# Patient Record
Sex: Female | Born: 1975 | Race: Black or African American | Hispanic: No | Marital: Married | Smoking: Never smoker
Health system: Southern US, Community
[De-identification: ages and names within clinical notes are randomized; demographics above are authoritative.]

## PROBLEM LIST (undated history)

## (undated) DIAGNOSIS — IMO0002 Reserved for concepts with insufficient information to code with codable children: Secondary | ICD-10-CM

## (undated) DIAGNOSIS — R87619 Unspecified abnormal cytological findings in specimens from cervix uteri: Secondary | ICD-10-CM

## (undated) DIAGNOSIS — D649 Anemia, unspecified: Secondary | ICD-10-CM

## (undated) HISTORY — PX: WISDOM TOOTH EXTRACTION: SHX21

## (undated) HISTORY — PX: OTHER SURGICAL HISTORY: SHX169

## (undated) HISTORY — DX: Reserved for concepts with insufficient information to code with codable children: IMO0002

---

## 1996-04-26 HISTORY — PX: LEEP: SHX91

## 1999-03-18 ENCOUNTER — Inpatient Hospital Stay (HOSPITAL_COMMUNITY): Admission: AD | Admit: 1999-03-18 | Discharge: 1999-03-18 | Payer: Self-pay | Admitting: *Deleted

## 1999-10-02 ENCOUNTER — Inpatient Hospital Stay (HOSPITAL_COMMUNITY): Admission: AD | Admit: 1999-10-02 | Discharge: 1999-10-02 | Payer: Self-pay | Admitting: *Deleted

## 2000-01-11 ENCOUNTER — Inpatient Hospital Stay (HOSPITAL_COMMUNITY): Admission: AD | Admit: 2000-01-11 | Discharge: 2000-01-11 | Payer: Self-pay | Admitting: Obstetrics & Gynecology

## 2000-07-10 ENCOUNTER — Inpatient Hospital Stay (HOSPITAL_COMMUNITY): Admission: AD | Admit: 2000-07-10 | Discharge: 2000-07-10 | Payer: Self-pay | Admitting: Obstetrics & Gynecology

## 2000-07-14 ENCOUNTER — Inpatient Hospital Stay (HOSPITAL_COMMUNITY): Admission: AD | Admit: 2000-07-14 | Discharge: 2000-07-14 | Payer: Self-pay | Admitting: Obstetrics & Gynecology

## 2000-12-13 ENCOUNTER — Encounter: Admission: RE | Admit: 2000-12-13 | Discharge: 2000-12-13 | Payer: Self-pay | Admitting: Obstetrics & Gynecology

## 2001-01-03 ENCOUNTER — Other Ambulatory Visit: Admission: RE | Admit: 2001-01-03 | Discharge: 2001-01-03 | Payer: Self-pay | Admitting: Obstetrics

## 2001-01-03 ENCOUNTER — Encounter: Admission: RE | Admit: 2001-01-03 | Discharge: 2001-01-03 | Payer: Self-pay | Admitting: Obstetrics & Gynecology

## 2001-08-08 ENCOUNTER — Inpatient Hospital Stay (HOSPITAL_COMMUNITY): Admission: AD | Admit: 2001-08-08 | Discharge: 2001-08-08 | Payer: Self-pay | Admitting: *Deleted

## 2003-05-16 ENCOUNTER — Inpatient Hospital Stay (HOSPITAL_COMMUNITY): Admission: AD | Admit: 2003-05-16 | Discharge: 2003-05-17 | Payer: Self-pay | Admitting: Family Medicine

## 2003-06-20 ENCOUNTER — Ambulatory Visit (HOSPITAL_COMMUNITY): Admission: RE | Admit: 2003-06-20 | Discharge: 2003-06-20 | Payer: Self-pay | Admitting: Obstetrics & Gynecology

## 2003-08-02 ENCOUNTER — Ambulatory Visit (HOSPITAL_COMMUNITY): Admission: RE | Admit: 2003-08-02 | Discharge: 2003-08-02 | Payer: Self-pay | Admitting: Obstetrics & Gynecology

## 2003-08-08 ENCOUNTER — Ambulatory Visit (HOSPITAL_COMMUNITY): Admission: RE | Admit: 2003-08-08 | Discharge: 2003-08-08 | Payer: Self-pay | Admitting: Obstetrics & Gynecology

## 2003-12-12 ENCOUNTER — Inpatient Hospital Stay (HOSPITAL_COMMUNITY): Admission: AD | Admit: 2003-12-12 | Discharge: 2003-12-13 | Payer: Self-pay | Admitting: Obstetrics & Gynecology

## 2003-12-26 ENCOUNTER — Inpatient Hospital Stay (HOSPITAL_COMMUNITY): Admission: AD | Admit: 2003-12-26 | Discharge: 2003-12-29 | Payer: Self-pay | Admitting: Obstetrics & Gynecology

## 2003-12-27 ENCOUNTER — Encounter (INDEPENDENT_AMBULATORY_CARE_PROVIDER_SITE_OTHER): Payer: Self-pay | Admitting: Specialist

## 2006-09-12 ENCOUNTER — Emergency Department (HOSPITAL_COMMUNITY): Admission: EM | Admit: 2006-09-12 | Discharge: 2006-09-12 | Payer: Self-pay | Admitting: Family Medicine

## 2006-10-11 ENCOUNTER — Ambulatory Visit: Payer: Self-pay | Admitting: Cardiovascular Disease

## 2010-04-26 DIAGNOSIS — R87613 High grade squamous intraepithelial lesion on cytologic smear of cervix (HGSIL): Secondary | ICD-10-CM | POA: Insufficient documentation

## 2010-05-16 ENCOUNTER — Encounter: Payer: Self-pay | Admitting: Obstetrics and Gynecology

## 2010-07-17 ENCOUNTER — Ambulatory Visit (HOSPITAL_COMMUNITY): Admission: RE | Admit: 2010-07-17 | Payer: Self-pay | Source: Ambulatory Visit | Admitting: Obstetrics & Gynecology

## 2010-09-08 NOTE — Assessment & Plan Note (Signed)
[facility]                            CARDIOLOGY OFFICE NOTE   NAME:Tammy Mclaughlin                    MRN:          [id number]  DATE:10/11/2006                            DOB:          [birth year]    Tammy Mclaughlin was seen as an outpatient at the [facility]  office on October 11, 2006, with the chief complaint of palpitations.   HISTORY OF PRESENT ILLNESS:  Ms. Tammy Mclaughlin is a 35 year old woman, who was  evaluated at urgent care last month for palpitations.  She has a long-  standing history of occasional palpitations that were exacerbated during  her pregnancies.  She had minimal symptoms over the past few years, but  had a marked increase in her symptoms last month.  This was not related  to increased caffeine intake or other exacerbating factors.  She had no  associated symptoms.  Her symptoms occurred at rest.  She remained  active and was asymptomatic during exercise or physical activity.  She  specifically denied chest pain, light-headedness, syncope, orthopnea,  PND, or shortness of breath.  She has not taken any medications.  She  drinks very rare caffeine, as it has led to panic attacks in the past.   CURRENT MEDICATIONS:  None.   ALLERGIES:  NKDA.   PAST MEDICAL HISTORY:  No chronic medical problems, surgeries or  hospitalizations.   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  She is married with three children, ages 10, 65 and 2.  She does not  smoke cigarettes, drink alcohol or use illicit drugs.  She does not  perform regular exercise.   FAMILY HISTORY:  Her mother is alive and well at age 32.  She has  hypertension.  She does not know her father's history.  She has no  siblings.  There is no history of coronary artery disease in the family.   REVIEW OF SYSTEMS:  A complete 12-point review of systems was performed.  The only pertinent positive to report is anxiety.   PHYSICAL EXAMINATION:  The  patient is alert and oriented, in no acute  distress.  She is a healthy-appearing woman.  Blood pressure is 116/74,  heart rate 71, respiratory rate is 12.  HEENT:  Normal.  NECK:  Normal carotid upstrokes without bruits.  Jugular venous pressure  is normal.  No thyromegaly or thyroid nodules.  LUNGS:  Clear to auscultation bilaterally.  HEART:  The apex is discrete and nondisplaced.  The heart is regular  rate and rhythm without murmurs or gallops.  ABDOMEN:  Soft, nontender, no organomegaly, no abdominal bruits.  EXTREMITIES:  No clubbing, cyanosis or edema.  Peripheral pulses are 2+  and equal throughout.  There are no femoral arterial bruits.  SKIN:  Warm and dry without rash.  NEUROLOGIC:  Cranial nerves II through XII are intact.  Strength is 5/5  and equal in the arms and legs bilaterally.   EKG:  Shows normal sinus rhythm and is within normal limits.   ASSESSMENT:  [name] is a 35 year old woman with palpitations.  Her  symptoms have greatly improved over the last month and she is back to  her baseline with minimal palpitations at present.  As she has a normal  physical examination and normal electrocardiogram, without any  associated symptoms, I suspect her palpitations are benign.  There are  no concerning aspects of her personal or family history.  I reassured  her and advised that, if she has progressive symptoms, we could consider  outpatient monitoring and assessment of her left ventricular function,  although I am almost certain it is normal.  At this point, I did not  recommend any testing or further evaluation.  If she has problems, I  have advised her to call the office and we can arrange followup at any  time.     Veverly Fells. Excell Seltzer, MD  Electronically Signed    MDC/MedQ  DD: 10/12/2006  DT: 10/12/2006  Job #: [id number]

## 2011-05-03 ENCOUNTER — Other Ambulatory Visit: Payer: Self-pay | Admitting: Obstetrics and Gynecology

## 2011-05-17 ENCOUNTER — Encounter (HOSPITAL_COMMUNITY): Payer: Self-pay | Admitting: *Deleted

## 2011-06-07 ENCOUNTER — Encounter (HOSPITAL_COMMUNITY): Payer: Self-pay

## 2011-06-08 ENCOUNTER — Encounter (HOSPITAL_COMMUNITY): Payer: Self-pay | Admitting: Obstetrics and Gynecology

## 2011-06-08 DIAGNOSIS — R87619 Unspecified abnormal cytological findings in specimens from cervix uteri: Secondary | ICD-10-CM | POA: Diagnosis present

## 2011-06-08 DIAGNOSIS — Z9111 Patient's noncompliance with dietary regimen: Secondary | ICD-10-CM

## 2011-06-08 DIAGNOSIS — R87613 High grade squamous intraepithelial lesion on cytologic smear of cervix (HGSIL): Secondary | ICD-10-CM | POA: Diagnosis present

## 2011-06-08 NOTE — H&P (Signed)
Tammy Mclaughlin is an 36 y.o. female. 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 Date: May 2012 OB History: G4, P3-0-1-3   Menstrual History: Menarche age: 50 LMP 05-22-11    Past Medical History  Diagnosis Date  . Abnormal Pap smear   . Anemia     Past Surgical History  Procedure Date  . Wisdom tooth extraction   . Svd     x 3  . Leep 1998    Family History  Problem Relation Age of Onset  . Diabetes Paternal Grandfather   . Diabetes Paternal Grandmother   . Hypertension Maternal Grandmother   . Hypertension Maternal Grandfather   . Cancer Mother     Social History:  reports that she has never smoked. She has never used smokeless tobacco. She reports that she does not drink alcohol or use illicit drugs.  Allergies: No Known Allergies  Prescriptions prior to admission  Medication Sig Dispense Refill  . levonorgestrel-ethinyl estradiol (AVIANE,ALESSE,LESSINA) 0.1-20 MG-MCG tablet Take 1 tablet by mouth daily.        Review of Systems  Constitutional: Negative.   HENT: Negative for congestion.   Eyes: Negative.   Respiratory: Negative for cough, hemoptysis, sputum production, shortness of breath and wheezing.   Cardiovascular: Negative.   Gastrointestinal: Negative.   Genitourinary: Negative.   Musculoskeletal: Negative.   Skin: Negative.   Neurological: Negative.   Endo/Heme/Allergies: Negative.   Psychiatric/Behavioral: Negative.     Blood pressure 117/73, pulse 78, temperature 98.1 F (36.7 C), temperature source Oral, resp. rate 16, height 5\' 7"  (1.702 m), weight 172 lb (78.019 kg), last  menstrual period 03/27/2011, SpO2 100.00%. Physical Exam  Vitals reviewed. Constitutional: She is oriented to person, place, and time. She appears well-developed and well-nourished.  HENT:  Head: Normocephalic and atraumatic.  Eyes: Conjunctivae are normal. Right eye exhibits no discharge. Left eye exhibits no discharge. No scleral icterus.  Neck: Normal range of motion. Neck supple.  Cardiovascular: Normal rate and regular rhythm.   Respiratory: Effort normal and breath sounds normal.  GI: Soft. Bowel sounds are normal. She exhibits no distension. There is no tenderness.  Genitourinary: Uterus normal. No vaginal discharge found.       Right bartholin's gland enlarged to 4 cm, nontender  Musculoskeletal: Normal range of motion.  Neurological: She is alert and oriented to person, place, and time.  Skin: Skin is warm and dry.  Psychiatric: She has a normal mood and affect.    Results for orders placed during the hospital encounter of 06/17/11 (from the past 24 hour(s))  CBC     Status: Abnormal   Collection Time   06/17/11  9:38 AM      Component Value Range   WBC 6.9  4.0 - 10.5 (K/uL)   RBC 4.02  3.87 - 5.11 (MIL/uL)   Hemoglobin 11.4 (*) 12.0 - 15.0 (g/dL)   HCT 16.1 (*) 09.6 - 46.0 (%)   MCV 87.1  78.0 - 100.0 (fL)   MCH 28.4  26.0 - 34.0 (pg)   MCHC 32.6  30.0 - 36.0 (g/dL)   RDW 04.5  40.9 - 81.1 (%)   Platelets 234  150 - 400 (K/uL)  PREGNANCY, URINE  Status: Normal   Collection Time   06/17/11  9:38 AM      Component Value Range   Preg Test, Ur NEGATIVE  NEGATIVE     No results found.  Assessment/Plan: HGSIL                                Prior LEEP Plan CKC.  Alternatives, risks and benefits discussed and patient wishes to proceed.   Ketura Sirek P 06/17/2011, 10:54 AM

## 2011-06-17 ENCOUNTER — Encounter (HOSPITAL_COMMUNITY): Payer: Self-pay | Admitting: Anesthesiology

## 2011-06-17 ENCOUNTER — Encounter (HOSPITAL_COMMUNITY)
Admission: RE | Disposition: A | Discharge status: Home Health | Payer: Self-pay | Source: Ambulatory Visit | Attending: Obstetrics and Gynecology

## 2011-06-17 ENCOUNTER — Encounter (HOSPITAL_COMMUNITY): Payer: Self-pay | Admitting: *Deleted

## 2011-06-17 ENCOUNTER — Other Ambulatory Visit: Payer: Self-pay | Admitting: Obstetrics and Gynecology

## 2011-06-17 ENCOUNTER — Ambulatory Visit (HOSPITAL_COMMUNITY): Payer: BC Managed Care – PPO | Admitting: Anesthesiology

## 2011-06-17 ENCOUNTER — Ambulatory Visit (HOSPITAL_COMMUNITY)
Admission: RE | Admit: 2011-06-17 | Discharge: 2011-06-17 | Disposition: A | Discharge status: Home Health | Payer: BC Managed Care – PPO | Source: Ambulatory Visit | Attending: Obstetrics and Gynecology | Admitting: Obstetrics and Gynecology

## 2011-06-17 DIAGNOSIS — Z9111 Patient's noncompliance with dietary regimen: Secondary | ICD-10-CM

## 2011-06-17 DIAGNOSIS — R87613 High grade squamous intraepithelial lesion on cytologic smear of cervix (HGSIL): Secondary | ICD-10-CM | POA: Diagnosis present

## 2011-06-17 DIAGNOSIS — N750 Cyst of Bartholin's gland: Secondary | ICD-10-CM | POA: Diagnosis present

## 2011-06-17 DIAGNOSIS — D069 Carcinoma in situ of cervix, unspecified: Secondary | ICD-10-CM | POA: Insufficient documentation

## 2011-06-17 DIAGNOSIS — R87619 Unspecified abnormal cytological findings in specimens from cervix uteri: Secondary | ICD-10-CM | POA: Diagnosis present

## 2011-06-17 HISTORY — PX: CERVICAL CONIZATION W/BX: SHX1330

## 2011-06-17 HISTORY — DX: Unspecified abnormal cytological findings in specimens from cervix uteri: R87.619

## 2011-06-17 HISTORY — DX: Anemia, unspecified: D64.9

## 2011-06-17 HISTORY — DX: Reserved for concepts with insufficient information to code with codable children: IMO0002

## 2011-06-17 LAB — CBC
HCT: 35 % — ABNORMAL LOW (ref 36.0–46.0)
MCHC: 32.6 g/dL (ref 30.0–36.0)
Platelets: 234 10*3/uL (ref 150–400)
RDW: 13.6 % (ref 11.5–15.5)
WBC: 6.9 10*3/uL (ref 4.0–10.5)

## 2011-06-17 LAB — PREGNANCY, URINE: Preg Test, Ur: NEGATIVE

## 2011-06-17 SURGERY — CONE BIOPSY, CERVIX
Anesthesia: General | Site: Vagina | Wound class: Clean Contaminated

## 2011-06-17 MED ORDER — PROPOFOL 10 MG/ML IV EMUL
INTRAVENOUS | Status: AC
  Filled 2011-06-17: qty 50

## 2011-06-17 MED ORDER — KETOROLAC TROMETHAMINE 30 MG/ML IJ SOLN
INTRAMUSCULAR | Status: AC
  Filled 2011-06-17: qty 1

## 2011-06-17 MED ORDER — IBUPROFEN 600 MG PO TABS: 600.0000 mg | ORAL_TABLET | Freq: Four times a day (QID) | ORAL | Status: AC

## 2011-06-17 MED ORDER — VASOPRESSIN 20 UNIT/ML IJ SOLN
INTRAMUSCULAR | Status: AC
  Filled 2011-06-17: qty 1

## 2011-06-17 MED ORDER — VASOPRESSIN 20 UNIT/ML IJ SOLN
INTRAVENOUS | Status: DC | PRN
  Administered 2011-06-17: 11:00:00 via INTRAMUSCULAR

## 2011-06-17 MED ORDER — HYDROCODONE-ACETAMINOPHEN 5-500 MG PO TABS: 1.0000 | ORAL_TABLET | Freq: Four times a day (QID) | ORAL | Status: AC | PRN

## 2011-06-17 MED ORDER — FENTANYL CITRATE 0.05 MG/ML IJ SOLN
INTRAMUSCULAR | Status: DC | PRN
  Administered 2011-06-17 (×2): 50 ug via INTRAVENOUS
  Administered 2011-06-17: 100 ug via INTRAVENOUS

## 2011-06-17 MED ORDER — ONDANSETRON 4 MG PO TBDP
ORAL_TABLET | ORAL | Status: AC
  Administered 2011-06-17: 4 mg via ORAL
  Filled 2011-06-17: qty 1

## 2011-06-17 MED ORDER — FENTANYL CITRATE 0.05 MG/ML IJ SOLN
INTRAMUSCULAR | Status: AC
  Filled 2011-06-17: qty 2

## 2011-06-17 MED ORDER — KETOROLAC TROMETHAMINE 30 MG/ML IJ SOLN
INTRAMUSCULAR | Status: DC | PRN
  Administered 2011-06-17: 30 mg via INTRAVENOUS

## 2011-06-17 MED ORDER — LIDOCAINE HCL (CARDIAC) 20 MG/ML IV SOLN
INTRAVENOUS | Status: AC
  Filled 2011-06-17: qty 5

## 2011-06-17 MED ORDER — ONDANSETRON 4 MG PO TBDP
4.0000 mg | ORAL_TABLET | Freq: Once | ORAL | Status: AC
  Administered 2011-06-17: 4 mg via ORAL

## 2011-06-17 MED ORDER — PROPOFOL 10 MG/ML IV EMUL
INTRAVENOUS | Status: AC
  Filled 2011-06-17: qty 20

## 2011-06-17 MED ORDER — LIDOCAINE HCL (CARDIAC) 20 MG/ML IV SOLN
INTRAVENOUS | Status: DC | PRN
  Administered 2011-06-17: 80 mg via INTRAVENOUS

## 2011-06-17 MED ORDER — KETOROLAC TROMETHAMINE 60 MG/2ML IM SOLN
INTRAMUSCULAR | Status: AC
  Filled 2011-06-17: qty 2

## 2011-06-17 MED ORDER — LIDOCAINE HCL 2 % IJ SOLN
INTRAMUSCULAR | Status: AC
  Filled 2011-06-17: qty 1

## 2011-06-17 MED ORDER — MIDAZOLAM HCL 5 MG/5ML IJ SOLN
INTRAMUSCULAR | Status: DC | PRN
  Administered 2011-06-17: 2 mg via INTRAVENOUS

## 2011-06-17 MED ORDER — LIDOCAINE HCL 2 % IJ SOLN
INTRAMUSCULAR | Status: DC | PRN
  Administered 2011-06-17: 10 mL

## 2011-06-17 MED ORDER — KETOROLAC TROMETHAMINE 60 MG/2ML IM SOLN
INTRAMUSCULAR | Status: DC | PRN
  Administered 2011-06-17: 30 mg via INTRAMUSCULAR

## 2011-06-17 MED ORDER — PROPOFOL 10 MG/ML IV EMUL
INTRAVENOUS | Status: DC | PRN
  Administered 2011-06-17: 200 mg via INTRAVENOUS

## 2011-06-17 MED ORDER — MIDAZOLAM HCL 2 MG/2ML IJ SOLN
INTRAMUSCULAR | Status: AC
  Filled 2011-06-17: qty 2

## 2011-06-17 MED ORDER — METOCLOPRAMIDE HCL 5 MG/ML IJ SOLN
INTRAMUSCULAR | Status: DC | PRN
  Administered 2011-06-17: 10 mg via INTRAVENOUS

## 2011-06-17 MED ORDER — METOCLOPRAMIDE HCL 5 MG/ML IJ SOLN
INTRAMUSCULAR | Status: AC
  Filled 2011-06-17: qty 2

## 2011-06-17 MED ORDER — ONDANSETRON HCL 4 MG/2ML IJ SOLN
INTRAMUSCULAR | Status: AC
  Filled 2011-06-17: qty 2

## 2011-06-17 MED ORDER — LACTATED RINGERS IV SOLN
INTRAVENOUS | Status: DC
  Administered 2011-06-17 (×3): via INTRAVENOUS

## 2011-06-17 SURGICAL SUPPLY — 25 items
APPLICATOR COTTON TIP 6IN STRL (MISCELLANEOUS) ×4 IMPLANT
BLADE SURG 11 STRL SS (BLADE) ×2 IMPLANT
CLOTH BEACON ORANGE TIMEOUT ST (SAFETY) ×2 IMPLANT
CONTAINER PREFILL 10% NBF 60ML (FORM) ×2 IMPLANT
COUNTER NEEDLE 1200 MAGNETIC (NEEDLE) ×2 IMPLANT
ELECT LLETZ BALL 5MM DISP (ELECTRODE) ×2 IMPLANT
ELECT REM PT RETURN 9FT ADLT (ELECTROSURGICAL) ×2
ELECTRODE REM PT RTRN 9FT ADLT (ELECTROSURGICAL) IMPLANT
GLOVE SURG SS PI 6.5 STRL IVOR (GLOVE) ×4 IMPLANT
GOWN PREVENTION PLUS LG XLONG (DISPOSABLE) ×4 IMPLANT
NDL SPNL 22GX3.5 QUINCKE BK (NEEDLE) ×2 IMPLANT
NEEDLE SPNL 22GX3.5 QUINCKE BK (NEEDLE) ×4 IMPLANT
PACK VAGINAL MINOR WOMEN LF (CUSTOM PROCEDURE TRAY) ×2 IMPLANT
PENCIL BUTTON HOLSTER BLD 10FT (ELECTRODE) ×1 IMPLANT
SCOPETTES 8  STERILE (MISCELLANEOUS) ×2
SCOPETTES 8 STERILE (MISCELLANEOUS) ×2 IMPLANT
SPONGE SURGIFOAM ABS GEL 12-7 (HEMOSTASIS) ×1 IMPLANT
SUT VIC AB 0 CT1 18XCR BRD8 (SUTURE) ×1 IMPLANT
SUT VIC AB 0 CT1 8-18 (SUTURE) ×2
SYR CONTROL 10ML LL (SYRINGE) ×4 IMPLANT
SYR TB 1ML 27GX1/2 SAFE (SYRINGE) IMPLANT
SYR TB 1ML 27GX1/2 SAFETY (SYRINGE) ×2
TOWEL OR 17X24 6PK STRL BLUE (TOWEL DISPOSABLE) ×4 IMPLANT
TUBING NON-CON 1/4 X 20 CONN (TUBING) ×2 IMPLANT
YANKAUER SUCT BULB TIP NO VENT (SUCTIONS) ×2 IMPLANT

## 2011-06-17 NOTE — Anesthesia Preprocedure Evaluation (Signed)
Anesthesia Evaluation  Patient identified by MRN, date of birth, ID band Patient awake    Reviewed: Allergy & Precautions, H&P , NPO status , Patient's Chart, lab work & pertinent test results  Airway Mallampati: II TM Distance: >3 FB Neck ROM: full    Dental No notable dental hx. (+) Teeth Intact   Pulmonary neg pulmonary ROS,  clear to auscultation  Pulmonary exam normal       Cardiovascular neg cardio ROS regular Normal    Neuro/Psych Negative Neurological ROS  Negative Psych ROS   GI/Hepatic negative GI ROS, Neg liver ROS,   Endo/Other  Negative Endocrine ROS  Renal/GU negative Renal ROS  Genitourinary negative   Musculoskeletal   Abdominal Normal abdominal exam  (+)   Peds  Hematology negative hematology ROS (+)   Anesthesia Other Findings   Reproductive/Obstetrics negative OB ROS                           Anesthesia Physical Anesthesia Plan  ASA: II  Anesthesia Plan: General LMA   Post-op Pain Management:    Induction:   Airway Management Planned:   Additional Equipment:   Intra-op Plan:   Post-operative Plan:   Informed Consent: I have reviewed the patients History and Physical, chart, labs and discussed the procedure including the risks, benefits and alternatives for the proposed anesthesia with the patient or authorized representative who has indicated his/her understanding and acceptance.     Plan Discussed with: Anesthesiologist, CRNA and Surgeon  Anesthesia Plan Comments:         Anesthesia Quick Evaluation

## 2011-06-17 NOTE — Op Note (Signed)
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 The perineum was prepped with multiple layers of Betadine. The vagina was gently prepped with a Betadine soaked swab being careful not to apply significant friction to the cervix. The bladder was emptied with a red Robinson catheter.  A weighted speculum was placed in the posterior vagina. A paracervical block was placed at the 5 and 7:00 positions with 1% lidocaine for a total of 10 cc. The stroma of the cervix was then infiltrated with a dilute solution of Pitressin for a total of 20 cc by mouth a single-tooth tenaculum was placed on the cervix outside the transition zone. Lugol's stain was applied to the cervix.   hemostatic sutures were placed at the 9 and 3:00 positions and tied down.  A cone shaped specimen was then excised including the cervical os and the Lugol's nonstaining area. The specimen was marked at the 12:00 position with a suture. It was removed from the operative field the endocervical canal was then curetted as a separate specimen.  Hemostatic sutures were placed at the 12 and 6:00 positions, after the conization bed had been cauterized for hemostasis.   a running interlocking suture was placed on the cut and the conization area with adequate hemostasis noted. A piece of Gelfoam was placed in the conization bed. Hemostasis was noted to be adequate. All instruments were then removed from the vagina and  the patient was awakened from general anesthesia and taken to the recovery room in satisfactory condition having tolerated the procedure well with sponge and instrument counts correct.   Specimens to pathology: #1 cervical conization. #2 endocervical curettings  Complications none  Estimated blood loss: Less than 25 cc

## 2011-06-17 NOTE — Transfer of Care (Signed)
Immediate Anesthesia Transfer of Care Note  Patient: Tammy Mclaughlin  Procedure(s) Performed: Procedure(s) (LRB): CONIZATION CERVIX WITH BIOPSY (N/A)  Patient Location: PACU  Anesthesia Type: General  Level of Consciousness: sedated  Airway & Oxygen Therapy: Patient Spontanous Breathing and Patient connected to nasal cannula oxygen  Post-op Assessment: Report given to PACU RN  Post vital signs: Reviewed and stable  Complications: No apparent anesthesia complications

## 2011-06-17 NOTE — Discharge Instructions (Signed)
Conization of the Cervix Conization is the cutting (excision) of a cone-shaped portion of the cervix. This procedure is usually done when there is abnormal bleeding from the cervix. It can also be done to evaluate an abnormal Pap smear or if an abnormality is seen on the cervix during an exam. Conization of the cervix is not done during a menstrual period or pregnancy. BEFORE THE PROCEDURE  Do not eat or drink anything for 6 to 8 hours before the procedure, especially if you are going to be given a drug to make you sleep (general anesthetic).   Do not take aspirin or blood thinners for at least a week before the procedure, or as directed.   Arrive at least an hour before the procedure to read and sign the necessary forms.   Arrange for someone to take you home after the procedure.   If you smoke, do not smoke for 2 weeks before the procedure.  LET YOUR CAREGIVER KNOW THE FOLLOWING:  Allergies to food or medications.   All the medications you are taking including over-the-counter and prescription medications, herbs, eyedrops, and creams.   You develop a cold or an infection.   If you are using illegal drugs or drinking too much alcohol.   Your smoking habits.   Previous problems with anesthetics including novocaine.   The possibility of being pregnant.   History of blood clots or other bleeding problems.   Other medical problems.  RISKS AND COMPLICATIONS   Bleeding.   Infection.   Damage to the cervix.   Injury to surrounding organs.   Problems with the anesthesia.  PROCEDURE Conization of the cervix can be done by:  Cold knife. This type cuts out the cervical canal and the transformation zone (where the normal cells end and the abnormal cells begin) with a scalpel.   LEEP (electrocautery). This type is done with a thin wire that can cut and burn (cauterize) the cervical tissue with an electrical current.   Laser treatment. This type cuts and burns (cauterizes) the  tissue of the cervix to prevent bleeding with a laser beam.  You will be given a drug to make you sleep (general anesthetic) for cold knife and laser treatments, and you will be given a numbing medication (local anesthetic) for the LEEP procedure. Conization is usually done using colposcopy. Colposcopy magnifies the cervix to see it more clearly. The tissue removed is examined under a microscope by a doctor (pathologist). The pathologist will provide a report to your caregiver. This will help your caregiver decide if further treatment is necessary. This report will also help your caregiver decide on the best treatment if your results are abnormal.  AFTER THE PROCEDURE  If you had a general anesthetic, you may be groggy for a couple of hours after the procedure.   If you had a local anesthetic, you will be advised to rest at the surgical center or caregiver's office until you are stable and feel ready to go home.   Have someone take you home.   You may have some cramping for a couple of days.   You may have a bloody discharge or light bleeding for 2 to 4 weeks.   You may have a black discharge coming from the vagina. This is from the paste used on the cervix to prevent bleeding. This is normal discharge.  HOME CARE INSTRUCTIONS   Do not drive for 24 hours.   Avoid strenuous activities and exercises for at least 7 -   10 days.   Only take over-the-counter or prescription medicines for pain, discomfort, or fever as directed by your caregiver.   Do not take aspirin. It can cause or aggravate bleeding.   You may resume your usual diet.   Drink 6 to 8 glasses of fluid a day.   Rest and sleep the first 24 to 48 hours.   Take showers instead of baths until your caregiver gives you the okay.   Do not use tampons, douche or have intercourse for 4 weeks, or as advised by your caregiver.   Do not lift anything over 10 pounds (4.5 kg) for at least 7 to 10 days, or as advised by your caregiver.     Take your temperature twice a day, for 4 to 5 days. Write them down.   Do not drink or drive when taking pain medication.   If you develop constipation:   Take a mild laxative with the advice of your caregiver.   Eat bran foods.   Drink a lot of fluids.   Try to have someone with you or available for you the first 24 to 48 hours, especially if you had a general anesthetic.   Make sure you and your family understand everything about your surgery and recovery.   Follow your caregiver's advice regarding follow-up appointments and Pap smears.  SEEK MEDICAL CARE IF:   You develop a rash.   You are dizzy or light-headed.   You feel sick to your stomach (nauseous).   You develop a bad smelling vaginal discharge.   You have an abnormal reaction or allergy to your medication.   You need stronger pain medication.  SEEK IMMEDIATE MEDICAL CARE IF:   You have blood clots or bleeding that is heavier than a normal menstrual period, or you develop bright red bleeding.   An oral temperature above 102 F (38.9 C) develops and persists for several hours.   You have increasing cramps.   You pass out.   You have painful or bloody urine.   You start throwing up (vomiting).   The pain is not relieved with your pain medication.  Not all test results are available during your visit. If your test results are not back during your the visit, make an appointment with your caregiver to find out the results. Do not assume everything is normal if you have not heard from your caregiver or the medical facility. It is important for you to follow up on all of your test results. Document Released: 01/20/2005 Document Revised: 12/23/2010 Document Reviewed: 11/11/2008 Exit[employer] Patient Information 2012 [employer]. 

## 2011-06-17 NOTE — Anesthesia Postprocedure Evaluation (Signed)
  Anesthesia Post-op Note  Patient: Tammy Mclaughlin  Procedure(s) Performed: Procedure(s) (LRB): CONIZATION CERVIX WITH BIOPSY (N/A)  Patient Location: PACU  Anesthesia Type: General  Level of Consciousness: awake, alert  and oriented  Airway and Oxygen Therapy: Patient Spontanous Breathing  Post-op Pain: none  Post-op Assessment: Post-op Vital signs reviewed, Patient's Cardiovascular Status Stable, Respiratory Function Stable, Patent Airway, No signs of Nausea or vomiting and Pain level controlled  Post-op Vital Signs: Reviewed and stable  Complications: No apparent anesthesia complications

## 2011-06-18 ENCOUNTER — Encounter (HOSPITAL_COMMUNITY): Payer: Self-pay | Admitting: Obstetrics and Gynecology

## 2011-07-12 ENCOUNTER — Encounter (INDEPENDENT_AMBULATORY_CARE_PROVIDER_SITE_OTHER): Payer: BC Managed Care – PPO | Admitting: Obstetrics and Gynecology

## 2011-07-12 DIAGNOSIS — R87613 High grade squamous intraepithelial lesion on cytologic smear of cervix (HGSIL): Secondary | ICD-10-CM

## 2011-10-18 ENCOUNTER — Ambulatory Visit: Payer: BC Managed Care – PPO | Admitting: Obstetrics and Gynecology

## 2012-01-12 ENCOUNTER — Ambulatory Visit (INDEPENDENT_AMBULATORY_CARE_PROVIDER_SITE_OTHER): Payer: BC Managed Care – PPO | Admitting: Obstetrics and Gynecology

## 2012-01-12 ENCOUNTER — Encounter: Payer: Self-pay | Admitting: Obstetrics and Gynecology

## 2012-01-12 VITALS — BP 120/64 | Ht 67.0 in | Wt 178.0 lb

## 2012-01-12 DIAGNOSIS — R350 Frequency of micturition: Secondary | ICD-10-CM

## 2012-01-12 DIAGNOSIS — R87613 High grade squamous intraepithelial lesion on cytologic smear of cervix (HGSIL): Secondary | ICD-10-CM

## 2012-01-12 DIAGNOSIS — Z124 Encounter for screening for malignant neoplasm of cervix: Secondary | ICD-10-CM

## 2012-01-12 MED ORDER — LEVONORGESTREL-ETHINYL ESTRAD 0.1-20 MG-MCG PO TABS: 1.0000 | ORAL_TABLET | Freq: Every day | ORAL | Status: DC

## 2012-01-12 MED ORDER — SULFAMETHOXAZOLE-TMP DS 800-160 MG PO TABS: 1.0000 | ORAL_TABLET | Freq: Two times a day (BID) | ORAL | Status: DC

## 2012-01-12 MED ORDER — MISOPROSTOL 200 MCG PO TABS: ORAL_TABLET | ORAL | Status: DC

## 2012-01-12 NOTE — Progress Notes (Signed)
AEX:  Last Pap: 09/17/2010 WNL: No HSIL Regular Periods:yes Contraception: BC pill  Monthly Breast exam:no Tetanus<58yrs: yes Nl.Bladder Function:no pt c/o uti sx Daily BMs:yes Healthy Diet:no Calcium:no Mammogram:no Date of Mammogram: n/a Exercise:yes Have often Exercise: occasional  Seatbelt: yes Abuse at home: no Stressful work:yes Sigmoid-colonoscopy: n/a Bone Density: No PCP: [facility] Change in PMH: none Change in [facility]: none  Pt c/o urinary pressure and frequency. Believes she may have UTI. Chem 9 not performed due to pt taking AZO. Also wanting to discuss possibly getting Mirena. Subjective:    Tammy Mclaughlin is a 36 y.o. female, [phone], who presents for an annual exam.     History   Social History  . Marital Status: Married    Spouse Name: N/A    Number of Children: N/A  . Years of Education: N/A   Social History Main Topics  . Smoking status: Never Smoker   . Smokeless tobacco: Never Used  . Alcohol Use: No  . Drug Use: No  . Sexually Active: Yes    Birth Control/ Protection: Pill   Other Topics Concern  . None   Social History Narrative  . None    Menstrual cycle:   LMP: Patient's last menstrual period was 12/13/2011.           Cycle:monthly with drawal from BCPs.  No IM bleeding.  When not on BCPs, has q 3 month menses  The following portions of the patient's history were reviewed and updated as appropriate: allergies, current medications, past family history, past medical history, past social history, past surgical history and problem list.  Review of Systems Pertinent items are noted in HPI. Breast:Negative for breast lump,nipple discharge or nipple retraction Gastrointestinal: Negative for abdominal pain, change in bowel habits or rectal bleeding Urinary:  Has freqency, urgency and pelvic pain-dysuria for several days.  Taking OTC meds for now   Objective:    BP 120/64  Ht 5\' 7"  (1.702 m)  Wt 178 lb (80.74 kg)  BMI 27.88 kg/m2  LMP  12/13/2011    Weight:  Wt Readings from Last 1 Encounters:  01/12/12 178 lb (80.74 kg)          BMI: Body mass index is 27.88 kg/(m^2).  General Appearance: Alert, appropriate appearance for age. No acute distress HEENT: Grossly normal Neck / Thyroid: Supple, no masses, nodes or enlargement Lungs: clear to auscultation bilaterally Back: No CVA tenderness Breast Exam: No masses or nodes.No dimpling, nipple retraction or discharge. Cardiovascular: Regular rate and rhythm. S1, S2, no murmur Gastrointestinal: Soft, non-tender, no masses or organomegaly Pelvic Exam: Vulva and vagina appear normal. Bimanual exam reveals normal uterus and adnexa. COMPRESSION OF BLADDER IS PAINFUL Rectovaginal: normal rectal, no masses Lymphatic Exam: Non-palpable nodes in neck, clavicular, axillary, or inguinal regions Skin: no rash or abnormalities Neurologic: Normal gait and speech, no tremor  Psychiatric: Alert and oriented, appropriate affect.   Wet Prep:not applicable Urinalysis:not applicable because of current use of azo capsules UPT: Not done   Assessment:    Probable UTI  Hx abnl pap s/p CKC   Plan:   Septra DS Urine C&S pap smear return annually or prn Wants to have Mirena if covered by insurance STD screening: done Contraception:oral contraceptives (estrogen/progesterone) until Mirena      Dierdre Forth, MD

## 2012-01-12 NOTE — Patient Instructions (Addendum)
Urinary Tract Infection Infections of the urinary tract can start in several places. A bladder infection (cystitis), a kidney infection (pyelonephritis), and a prostate infection (prostatitis) are different types of urinary tract infections (UTIs). They usually get better if treated with medicines (antibiotics) that kill germs. Take all the medicine until it is gone. You or your child may feel better in a few days, but TAKE ALL MEDICINE or the infection may not respond and may become more difficult to treat. HOME CARE INSTRUCTIONS   Drink enough water and fluids to keep the urine clear or pale yellow. Cranberry juice is especially recommended, in addition to large amounts of water.   Avoid caffeine, tea, and carbonated beverages. They tend to irritate the bladder.   Alcohol may irritate the prostate.   Only take over-the-counter or prescription medicines for pain, discomfort, or fever as directed by your caregiver.  To prevent further infections:  Empty the bladder often. Avoid holding urine for long periods of time.   After a bowel movement, women should cleanse from front to back. Use each tissue only once.   Empty the bladder before and after sexual intercourse.  FINDING OUT THE RESULTS OF YOUR TEST Not all test results are available during your visit. If your or your child's test results are not back during the visit, make an appointment with your caregiver to find out the results. Do not assume everything is normal if you have not heard from your caregiver or the medical facility. It is important for you to follow up on all test results. SEEK MEDICAL CARE IF:   There is back pain.   Your baby is older than 3 months with a rectal temperature of 100.5 F (38.1 C) or higher for more than 1 day.   Your or your child's problems (symptoms) are no better in 3 days. Return sooner if you or your child is getting worse.  SEEK IMMEDIATE MEDICAL CARE IF:   There is severe back pain or lower  abdominal pain.   You or your child develops chills.   You have a fever.   Your baby is older than 3 months with a rectal temperature of 102 F (38.9 C) or higher.   Your baby is 30 months old or younger with a rectal temperature of 100.4 F (38 C) or higher.   There is nausea or vomiting.   There is continued burning or discomfort with urination.  MAKE SURE YOU:   Understand these instructions.   Will watch your condition.   Will get help right away if you are not doing well or get worse.  Document Released: 01/20/2005 Document Revised: 04/01/2011 Document Reviewed: 08/25/2006 [facility] Patient Information 2012 [place], [place].  Levonorgestrel intrauterine device (IUD) What is this medicine? LEVONORGESTREL IUD (LEE voe nor jes trel) is a contraceptive (birth control) device. It is used to prevent pregnancy and to treat heavy bleeding that occurs during your period. It can be used for up to 5 years. This medicine may be used for other purposes; ask your health care provider or pharmacist if you have questions. What should I tell my health care provider before I take this medicine? They need to know if you have any of these conditions: -abnormal Pap smear -cancer of the breast, uterus, or cervix -diabetes -endometritis -genital or pelvic infection now or in the past -have more than one sexual partner or your partner has more than one partner -heart disease -history of an ectopic or tubal pregnancy -immune system  problems -IUD in place -liver disease or tumor -problems with blood clots or take blood-thinners -use intravenous drugs -uterus of unusual shape -vaginal bleeding that has not been explained -an unusual or allergic reaction to levonorgestrel, other hormones, silicone, or polyethylene, medicines, foods, dyes, or preservatives -pregnant or trying to get pregnant -breast-feeding How should I use this medicine? This device is placed inside the uterus by a health  care professional. Talk to your pediatrician regarding the use of this medicine in children. Special care may be needed. Overdosage: If you think you have taken too much of this medicine contact a poison control center or emergency room at once. NOTE: This medicine is only for you. Do not share this medicine with others. What if I miss a dose? This does not apply. What may interact with this medicine? Do not take this medicine with any of the following medications: -amprenavir -bosentan -fosamprenavir This medicine may also interact with the following medications: -aprepitant -barbiturate medicines for inducing sleep or treating seizures -bexarotene -griseofulvin -medicines to treat seizures like carbamazepine, ethotoin, felbamate, oxcarbazepine, phenytoin, topiramate -modafinil -pioglitazone -rifabutin -rifampin -rifapentine -some medicines to treat HIV infection like atazanavir, indinavir, lopinavir, nelfinavir, tipranavir, ritonavir -St. John's wort -warfarin This list may not describe all possible interactions. Give your health care provider a list of all the medicines, herbs, non-prescription drugs, or dietary supplements you use. Also tell them if you smoke, drink alcohol, or use illegal drugs. Some items may interact with your medicine. What should I watch for while using this medicine? Visit your doctor or health care professional for regular check ups. See your doctor if you or your partner has sexual contact with others, becomes HIV positive, or gets a sexual transmitted disease. This product does not protect you against HIV infection (AIDS) or other sexually transmitted diseases. You can check the placement of the IUD yourself by reaching up to the top of your vagina with clean fingers to feel the threads. Do not pull on the threads. It is a good habit to check placement after each menstrual period. Call your doctor right away if you feel more of the IUD than just the threads  or if you cannot feel the threads at all. The IUD may come out by itself. You may become pregnant if the device comes out. If you notice that the IUD has come out use a backup birth control method like condoms and call your health care provider. Using tampons will not change the position of the IUD and are okay to use during your period. What side effects may I notice from receiving this medicine? Side effects that you should report to your doctor or health care professional as soon as possible: -allergic reactions like skin rash, itching or hives, swelling of the face, lips, or tongue -fever, flu-like symptoms -genital sores -high blood pressure -no menstrual period for 6 weeks during use -pain, swelling, warmth in the leg -pelvic pain or tenderness -severe or sudden headache -signs of pregnancy -stomach cramping -sudden shortness of breath -trouble with balance, talking, or walking -unusual vaginal bleeding, discharge -yellowing of the eyes or skin Side effects that usually do not require medical attention (report to your doctor or health care professional if they continue or are bothersome): -acne -breast pain -change in sex drive or performance -changes in weight -cramping, dizziness, or faintness while the device is being inserted -headache -irregular menstrual bleeding within first 3 to 6 months of use -nausea This list may not describe all possible side  effects. Call your doctor for medical advice about side effects. You may report side effects to FDA at 1-800-FDA-1088. Where should I keep my medicine? This does not apply. NOTE: This sheet is a summary. It may not cover all possible information. If you have questions about this medicine, talk to your doctor, pharmacist, or health care provider.  2012, [employer]. (05/03/2008 6:39:08 PM)

## 2012-01-14 LAB — PAP IG, CT-NG, RFX HPV ASCU: Chlamydia Probe Amp: NEGATIVE

## 2012-01-25 ENCOUNTER — Encounter: Payer: BC Managed Care – PPO | Admitting: Obstetrics and Gynecology

## 2012-02-08 ENCOUNTER — Telehealth: Payer: Self-pay | Admitting: Obstetrics and Gynecology

## 2012-02-08 NOTE — Telephone Encounter (Signed)
Tc to pt per telephone call. Pt due to start cycle tomorrow. Appt for IUD insertion sched 02/09/12@4 :15 with vph. Pt normally has only spotting on 1st day of menses. Advised pt to still use Cytotec tabs as directed. Pt to also take Tylenol or Ibuprofen prior to appt. Pt voices understanding.

## 2012-02-09 ENCOUNTER — Encounter: Payer: Self-pay | Admitting: Obstetrics and Gynecology

## 2012-02-09 ENCOUNTER — Ambulatory Visit (INDEPENDENT_AMBULATORY_CARE_PROVIDER_SITE_OTHER): Payer: BC Managed Care – PPO | Admitting: Obstetrics and Gynecology

## 2012-02-09 VITALS — BP 112/60 | Ht 66.0 in | Wt 176.0 lb

## 2012-02-09 DIAGNOSIS — Z30430 Encounter for insertion of intrauterine contraceptive device: Secondary | ICD-10-CM

## 2012-02-09 DIAGNOSIS — Z309 Encounter for contraceptive management, unspecified: Secondary | ICD-10-CM

## 2012-02-09 MED ORDER — LEVONORGESTREL 20 MCG/24HR IU IUD
INTRAUTERINE_SYSTEM | Freq: Once | INTRAUTERINE | Status: AC
  Administered 2012-02-09: 1 via INTRAUTERINE

## 2012-02-09 NOTE — Progress Notes (Signed)
IUD INSERTION  IUD: Mirena LOT#: [id number] Exp: 01/16 UPT: negative GC/CHLAMYDIA: negative 01/12/2012 CONSENT SIGNED: yes IBUPROFEN GIVEN: Yes 800 mg given DISINFECTION WITH  bETADINE X3 UTERUS SOUNDED AT 7 CM IUD INSERTED PER PROTOCOL: YES COMPLICATION: NONE PATIENT INSTRUCTED TO CALL IS FEVER OR ABNORMAL PAIN:yes PATIENT INSTRUCTED ON HOW TO CHECK IUD STRINGS: yes FOLLOW UP APPT: 6WKS

## 2012-03-27 ENCOUNTER — Encounter: Payer: BC Managed Care – PPO | Admitting: Obstetrics and Gynecology

## 2013-05-02 DIAGNOSIS — D649 Anemia, unspecified: Secondary | ICD-10-CM | POA: Insufficient documentation

## 2014-02-25 ENCOUNTER — Encounter: Payer: Self-pay | Admitting: Obstetrics and Gynecology

## 2015-09-11 DIAGNOSIS — Z1231 Encounter for screening mammogram for malignant neoplasm of breast: Secondary | ICD-10-CM | POA: Diagnosis not present

## 2015-09-11 DIAGNOSIS — Z01419 Encounter for gynecological examination (general) (routine) without abnormal findings: Secondary | ICD-10-CM | POA: Diagnosis not present

## 2015-09-11 DIAGNOSIS — Z30431 Encounter for routine checking of intrauterine contraceptive device: Secondary | ICD-10-CM | POA: Diagnosis not present

## 2015-09-11 DIAGNOSIS — Z124 Encounter for screening for malignant neoplasm of cervix: Secondary | ICD-10-CM | POA: Diagnosis not present

## 2015-09-11 DIAGNOSIS — R87613 High grade squamous intraepithelial lesion on cytologic smear of cervix (HGSIL): Secondary | ICD-10-CM | POA: Diagnosis not present

## 2016-04-28 DIAGNOSIS — Z0000 Encounter for general adult medical examination without abnormal findings: Secondary | ICD-10-CM | POA: Diagnosis not present

## 2016-04-28 DIAGNOSIS — Z1231 Encounter for screening mammogram for malignant neoplasm of breast: Secondary | ICD-10-CM | POA: Diagnosis not present

## 2016-04-28 DIAGNOSIS — Z23 Encounter for immunization: Secondary | ICD-10-CM | POA: Diagnosis not present

## 2016-04-28 DIAGNOSIS — H6123 Impacted cerumen, bilateral: Secondary | ICD-10-CM | POA: Diagnosis not present

## 2016-10-28 DIAGNOSIS — R87613 High grade squamous intraepithelial lesion on cytologic smear of cervix (HGSIL): Secondary | ICD-10-CM | POA: Diagnosis not present

## 2016-10-28 DIAGNOSIS — Z01419 Encounter for gynecological examination (general) (routine) without abnormal findings: Secondary | ICD-10-CM | POA: Diagnosis not present

## 2016-10-28 DIAGNOSIS — Z1231 Encounter for screening mammogram for malignant neoplasm of breast: Secondary | ICD-10-CM | POA: Diagnosis not present

## 2016-10-28 DIAGNOSIS — Z30431 Encounter for routine checking of intrauterine contraceptive device: Secondary | ICD-10-CM | POA: Diagnosis not present

## 2017-02-21 DIAGNOSIS — Z30433 Encounter for removal and reinsertion of intrauterine contraceptive device: Secondary | ICD-10-CM | POA: Diagnosis not present

## 2017-05-11 DIAGNOSIS — Z23 Encounter for immunization: Secondary | ICD-10-CM | POA: Diagnosis not present

## 2017-05-11 DIAGNOSIS — Z0000 Encounter for general adult medical examination without abnormal findings: Secondary | ICD-10-CM | POA: Diagnosis not present

## 2017-05-11 DIAGNOSIS — E559 Vitamin D deficiency, unspecified: Secondary | ICD-10-CM | POA: Diagnosis not present

## 2017-05-11 DIAGNOSIS — R7303 Prediabetes: Secondary | ICD-10-CM | POA: Diagnosis not present

## 2018-05-17 ENCOUNTER — Ambulatory Visit (INDEPENDENT_AMBULATORY_CARE_PROVIDER_SITE_OTHER): Payer: 59 | Admitting: Nurse Practitioner

## 2018-05-17 ENCOUNTER — Encounter: Payer: Self-pay | Admitting: Nurse Practitioner

## 2018-05-17 VITALS — BP 124/88 | HR 63 | Temp 98.1°F | Ht 66.6 in | Wt 153.0 lb

## 2018-05-17 DIAGNOSIS — Z0000 Encounter for general adult medical examination without abnormal findings: Secondary | ICD-10-CM

## 2018-05-17 DIAGNOSIS — Z23 Encounter for immunization: Secondary | ICD-10-CM

## 2018-05-17 DIAGNOSIS — E559 Vitamin D deficiency, unspecified: Secondary | ICD-10-CM | POA: Diagnosis not present

## 2018-05-17 LAB — POCT URINALYSIS DIPSTICK
Bilirubin, UA: NEGATIVE
GLUCOSE UA: NEGATIVE
Ketones, UA: NEGATIVE
Nitrite, UA: NEGATIVE
PROTEIN UA: POSITIVE — AB
Spec Grav, UA: >=1.03 — AB (ref 1.010–1.025)
Urobilinogen, UA: 0.2 E.U./dL
pH, UA: 5.5 (ref 5.0–8.0)

## 2018-05-17 MED ORDER — TETANUS-DIPHTH-ACELL PERTUSSIS 5-2.5-18.5 LF-MCG/0.5 IM SUSP
0.5000 mL | Freq: Once | INTRAMUSCULAR | Status: AC
  Administered 2018-05-17: 0.5 mL via INTRAMUSCULAR

## 2018-05-17 NOTE — Progress Notes (Signed)
Subjective:     Patient ID: Tammy Mclaughlin , female    DOB: 03/03/1976 , 43 y.o.   MRN: [id number]   Chief Complaint  Patient presents with  . Annual Exam   The patient states she uses IUD for birth control. Last LMP was Patient's last menstrual period was 05/10/2018.. Negative for Dysmenorrhea and Negative for Menorrhagia Mammogram is due now. Dr. Pennie Rushing.  Negative for: breast discharge, breast lump(s), breast pain and breast self exam.  Pertinent negatives include abnormal bleeding (hematology), anxiety, decreased libido, depression, difficulty falling sleep, dyspareunia, history of infertility, nocturia, sexual dysfunction, sleep disturbances, urinary incontinence, urinary urgency, vaginal discharge and vaginal itching. Diet: trying to watch what she is eating, no meat but eats seafood.  Was doing intermittent fasting starting last year between 11a-7p.The patient states her exercise level is  minimally, tries to walk at the office with a goal to get a mile per day.   The patient's tobacco use is:  Social History   Tobacco Use  Smoking Status Never Smoker  Smokeless Tobacco Never Used   She has been exposed to passive smoke. The patient's alcohol use is:  Social History   Substance and Sexual Activity  Alcohol Use No  Additional information: Last pap due now will call Dr. Lilian Coma office for an appt.  HPI  HPI   Past Medical History:  Diagnosis Date  . Abnormal Pap smear   . Anemia   . HGSIL (high grade squamous intraepithelial dysplasia)      Family History  Problem Relation Age of Onset  . Diabetes Paternal Grandfather   . Diabetes Paternal Grandmother   . Hypertension Maternal Grandmother   . Hypertension Maternal Grandfather   . Cancer Mother      Current Outpatient Medications:  .  levonorgestrel (MIRENA) 20 MCG/24HR IUD, 1 each by Intrauterine route once., Disp: , Rfl:    No Known Allergies   Review of Systems  Constitutional: Negative.   HENT:  Negative.   Eyes: Negative.   Respiratory: Negative.   Cardiovascular: Negative.   Gastrointestinal: Negative.   Endocrine: Negative.   Genitourinary: Negative.   Musculoskeletal: Negative.   Skin: Negative.   Allergic/Immunologic: Negative.   Neurological: Negative.   Hematological: Negative.   Psychiatric/Behavioral: Negative.      Today's Vitals   05/17/18 0937  BP: 124/88  Pulse: 63  Temp: 98.1 F (36.7 C)  TempSrc: Oral  SpO2: 98%  Weight: 153 lb (69.4 kg)  Height: 5' 6.6" (1.692 m)  PainSc: 0-No pain   Body mass index is 24.25 kg/m.   Objective:  Physical Exam Constitutional:      Appearance: Normal appearance. She is well-developed.  HENT:     Head: Normocephalic and atraumatic.     Right Ear: Hearing, tympanic membrane, ear canal and external ear normal.     Left Ear: Hearing, tympanic membrane, ear canal and external ear normal.     Nose: Nose normal.     Mouth/Throat:     Mouth: Mucous membranes are moist.  Eyes:     General: Lids are normal.     Extraocular Movements: Extraocular movements intact.     Conjunctiva/sclera: Conjunctivae normal.     Pupils: Pupils are equal, round, and reactive to light.     Funduscopic exam:    Right eye: No papilledema.        Left eye: No papilledema.  Neck:     Musculoskeletal: Full passive range of motion without pain, normal range  of motion and neck supple.     Thyroid: No thyroid mass.     Vascular: No carotid bruit.  Cardiovascular:     Rate and Rhythm: Normal rate and regular rhythm.     Pulses: Normal pulses.     Heart sounds: Normal heart sounds. No murmur.  Pulmonary:     Effort: Pulmonary effort is normal.     Breath sounds: Normal breath sounds.  Abdominal:     General: Bowel sounds are normal.     Palpations: Abdomen is soft.  Musculoskeletal: Normal range of motion.  Skin:    General: Skin is warm and dry.     Capillary Refill: Capillary refill takes less than 2 seconds.  Neurological:      General: No focal deficit present.     Mental Status: She is alert and oriented to person, place, and time.     Cranial Nerves: No cranial nerve deficit.     Sensory: No sensory deficit.  Psychiatric:        Mood and Affect: Mood normal.        Behavior: Behavior normal.        Thought Content: Thought content normal.        Judgment: Judgment normal.         Assessment And Plan:     1. Encounter for general adult medical examination w/o abnormal findings . Behavior modifications discussed and diet history reviewed.   . Pt will continue to exercise regularly and modify diet with low GI, plant based foods and decrease intake of processed foods.  . Recommend intake of daily multivitamin, Vitamin D, and calcium.  . Recommend mammogram (she is to call Dr. Pennie RushingHaygood) for preventive screenings, as well as recommend immunizations that include influenza she will get at work TDAP  - POCT Urinalysis Dipstick (81002) - Lipid panel - CMP14 + Anion Gap - Hemoglobin A1c - CBC  2. Vitamin D deficiency  Will check vitamin D level and supplement as needed.     Also encouraged to spend 15 minutes in the sun daily.  - Vitamin D (25 hydroxy)  3. Encounter for immunization  Will give tetanus vaccine today while in office. Refer to order management. TDAP will be administered to adults 4418-44 years old every 10 years. - Tdap (BOOSTRIX) injection 0.5 mL       Arnette FeltsJanece Asli Tokarski, FNP

## 2018-05-17 NOTE — Patient Instructions (Signed)

## 2018-05-18 LAB — CBC
Hematocrit: 36 % (ref 34.0–46.6)
Hemoglobin: 12.1 g/dL (ref 11.1–15.9)
MCH: 29 pg (ref 26.6–33.0)
MCHC: 33.6 g/dL (ref 31.5–35.7)
MCV: 86 fL (ref 79–97)
PLATELETS: 255 10*3/uL (ref 150–450)
RBC: 4.17 x10E6/uL (ref 3.77–5.28)
RDW: 13.2 % (ref 11.7–15.4)
WBC: 6 10*3/uL (ref 3.4–10.8)

## 2018-05-18 LAB — CMP14 + ANION GAP
ALK PHOS: 42 IU/L (ref 39–117)
ALT: 13 IU/L (ref 0–32)
ANION GAP: 16 mmol/L (ref 10.0–18.0)
AST: 15 IU/L (ref 0–40)
Albumin/Globulin Ratio: 1.6 (ref 1.2–2.2)
Albumin: 4.7 g/dL (ref 3.8–4.8)
BUN/Creatinine Ratio: 10 (ref 9–23)
BUN: 8 mg/dL (ref 6–24)
Bilirubin Total: 0.5 mg/dL (ref 0.0–1.2)
CO2: 22 mmol/L (ref 20–29)
CREATININE: 0.77 mg/dL (ref 0.57–1.00)
Calcium: 9.2 mg/dL (ref 8.7–10.2)
Chloride: 102 mmol/L (ref 96–106)
GFR calc Af Amer: 109 mL/min/{1.73_m2} (ref >59)
GFR calc non Af Amer: 95 mL/min/{1.73_m2} (ref >59)
GLUCOSE: 81 mg/dL (ref 65–99)
Globulin, Total: 2.9 g/dL (ref 1.5–4.5)
Potassium: 4.1 mmol/L (ref 3.5–5.2)
Sodium: 140 mmol/L (ref 134–144)
Total Protein: 7.6 g/dL (ref 6.0–8.5)

## 2018-05-18 LAB — LIPID PANEL
CHOL/HDL RATIO: 2.9 ratio (ref 0.0–4.4)
CHOLESTEROL TOTAL: 184 mg/dL (ref 100–199)
HDL: 64 mg/dL (ref >39)
LDL CALC: 108 mg/dL — AB (ref 0–99)
Triglycerides: 62 mg/dL (ref 0–149)
VLDL CHOLESTEROL CAL: 12 mg/dL (ref 5–40)

## 2018-05-18 LAB — VITAMIN D 25 HYDROXY (VIT D DEFICIENCY, FRACTURES): Vit D, 25-Hydroxy: 13.4 ng/mL — ABNORMAL LOW (ref 30.0–100.0)

## 2018-05-18 LAB — HEMOGLOBIN A1C
Est. average glucose Bld gHb Est-mCnc: 111 mg/dL
HEMOGLOBIN A1C: 5.5 % (ref 4.8–5.6)

## 2018-05-28 ENCOUNTER — Encounter: Payer: Self-pay | Admitting: Nurse Practitioner

## 2019-05-23 ENCOUNTER — Ambulatory Visit (INDEPENDENT_AMBULATORY_CARE_PROVIDER_SITE_OTHER): Payer: Managed Care, Other (non HMO) | Admitting: Nurse Practitioner

## 2019-05-23 ENCOUNTER — Encounter: Payer: Self-pay | Admitting: Nurse Practitioner

## 2019-05-23 ENCOUNTER — Other Ambulatory Visit: Payer: Self-pay

## 2019-05-23 VITALS — BP 112/76 | HR 81 | Temp 98.2°F | Ht 66.6 in | Wt 169.4 lb

## 2019-05-23 DIAGNOSIS — R238 Other skin changes: Secondary | ICD-10-CM

## 2019-05-23 DIAGNOSIS — Z0000 Encounter for general adult medical examination without abnormal findings: Secondary | ICD-10-CM | POA: Diagnosis not present

## 2019-05-23 DIAGNOSIS — E559 Vitamin D deficiency, unspecified: Secondary | ICD-10-CM | POA: Diagnosis not present

## 2019-05-23 LAB — POCT URINALYSIS DIPSTICK
Bilirubin, UA: NEGATIVE
Blood, UA: NEGATIVE
Glucose, UA: NEGATIVE
Ketones, UA: NEGATIVE
Leukocytes, UA: NEGATIVE
Nitrite, UA: NEGATIVE
Protein, UA: NEGATIVE
Spec Grav, UA: 1.02 (ref 1.010–1.025)
Urobilinogen, UA: 0.2 E.U./dL
pH, UA: 7 (ref 5.0–8.0)

## 2019-05-23 NOTE — Progress Notes (Signed)
Subjective:     Patient ID: Tammy Mclaughlin , female    DOB: [birth year] , 44 y.o.   MRN: [id number]   Chief Complaint  Patient presents with  . Annual Exam   HPI  Here for HM   She had been smoking black and milds and noticed having to clear her throat.    She had her 1st vaccine one weeks ago.  Done on 05/21/2019, [place].  She denies having any side effects except for a sore arm.     The patient states she uses IUD for birth control. Last Mammogram last done - she had been seeing Dr. Leo Grosser who has retired will see another provider at the office.  Negative for: breast discharge, breast lump(s), breast pain and breast self exam.  Pertinent negatives include abnormal bleeding (hematology), anxiety, decreased libido, depression, difficulty falling sleep, dyspareunia, history of infertility, nocturia, sexual dysfunction, sleep disturbances, urinary incontinence, urinary urgency, vaginal discharge and vaginal itching. Diet: pesctarian for the last 3 years. The patient states her exercise level is occasional walking.     The patient's tobacco use is:  Social History   Tobacco Use  Smoking Status Never Smoker  Smokeless Tobacco Never Used   She has been exposed to passive smoke. The patient's alcohol use is:  Social History   Substance and Sexual Activity  Alcohol Use No   Additional information: Last pap 2020 [facility].    Past Medical History:  Diagnosis Date  . Abnormal Pap smear   . Anemia   . HGSIL (high grade squamous intraepithelial dysplasia)      Family History  Problem Relation Age of Onset  . Diabetes Paternal Grandfather   . Diabetes Paternal Grandmother   . Hypertension Maternal Grandmother   . Hypertension Maternal Grandfather   . Cancer Mother      Current Outpatient Medications:  .  levonorgestrel (MIRENA) 20 MCG/24HR IUD, 1 each by Intrauterine route once., Disp: , Rfl:    No Known Allergies   Review of Systems  Constitutional:  Negative.   HENT: Negative.   Eyes: Negative.   Respiratory: Negative.   Cardiovascular: Negative.   Gastrointestinal: Negative.   Endocrine: Negative.   Genitourinary: Negative.   Musculoskeletal: Negative.   Skin: Positive for rash (few areas of skin to bilateral shins with raised vesicles).  Allergic/Immunologic: Negative.   Neurological: Negative.  Negative for dizziness and headaches.  Hematological: Negative.   Psychiatric/Behavioral: Negative.      Today's Vitals   05/23/19 0911  BP: 112/76  Pulse: 81  Temp: 98.2 F (36.8 C)  TempSrc: Oral  SpO2: 95%  Weight: 169 lb 6.4 oz (76.8 kg)  Height: 5' 6.6" (1.692 m)  PainSc: 0-No pain   Body mass index is 26.85 kg/m.   Objective:  Physical Exam Constitutional:      Appearance: Normal appearance. She is well-developed.  HENT:     Head: Normocephalic and atraumatic.     Right Ear: Hearing, tympanic membrane, ear canal and external ear normal.     Left Ear: Hearing, tympanic membrane, ear canal and external ear normal.     Nose: Nose normal.     Mouth/Throat:     Mouth: Mucous membranes are moist.  Eyes:     General: Lids are normal.     Extraocular Movements: Extraocular movements intact.     Conjunctiva/sclera: Conjunctivae normal.     Pupils: Pupils are equal, round, and reactive to light.     Funduscopic  exam:    Right eye: No papilledema.        Left eye: No papilledema.  Neck:     Thyroid: No thyroid mass.     Vascular: No carotid bruit.  Cardiovascular:     Rate and Rhythm: Normal rate and regular rhythm.     Pulses: Normal pulses.     Heart sounds: Normal heart sounds. No murmur.  Pulmonary:     Effort: Pulmonary effort is normal. No respiratory distress.     Breath sounds: Normal breath sounds.  Abdominal:     General: Bowel sounds are normal.     Palpations: Abdomen is soft.  Musculoskeletal:        General: Normal range of motion.     Cervical back: Full passive range of motion without pain,  normal range of motion and neck supple.  Skin:    General: Skin is warm and dry.     Capillary Refill: Capillary refill takes less than 2 seconds.     Comments: Right anterior shin with slight raised vesicle with peeling skin and left anterior shin with papular vesicle  Neurological:     General: No focal deficit present.     Mental Status: She is alert and oriented to person, place, and time.     Cranial Nerves: No cranial nerve deficit.     Sensory: No sensory deficit.  Psychiatric:        Mood and Affect: Mood normal.        Behavior: Behavior normal.        Thought Content: Thought content normal.        Judgment: Judgment normal.         Assessment And Plan:     1. Encounter for general adult medical examination w/o abnormal findings . Behavior modifications discussed and diet history reviewed.   . Pt will continue to exercise regularly and modify diet with low GI, plant based foods and decrease intake of processed foods.  . Recommend intake of daily multivitamin, Vitamin D, and calcium.  . Recommend mammogram for preventive screenings, as well as recommend immunizations that TDAP  - POCT Urinalysis Dipstick (81002)  2. Vitamin D deficiency  Will check vitamin D level and supplement as needed.     Also encouraged to spend 15 minutes in the sun daily.  - Vitamin D (25 hydroxy)  3. Vesicles  Two areas one on right lower extremity and one on left lower extremity  Encouraged to use a new razor when shaving a         Arnette Felts, FNP

## 2019-05-23 NOTE — Patient Instructions (Signed)
Health Maintenance  Topic Date Due  . PAP SMEAR-Modifier  01/12/2015  . HIV Screening  05/22/2020 (Originally 12/18/1990)  . TETANUS/TDAP  05/17/2028  . INFLUENZA VACCINE  Completed   Health Maintenance, Female Adopting a healthy lifestyle and getting preventive care are important in promoting health and wellness. Ask your health care provider about:  The right schedule for you to have regular tests and exams.  Things you can do on your own to prevent diseases and keep yourself healthy. What should I know about diet, weight, and exercise? Eat a healthy diet   Eat a diet that includes plenty of vegetables, fruits, low-fat dairy products, and lean protein.  Do not eat a lot of foods that are high in solid fats, added sugars, or sodium. Maintain a healthy weight Body mass index (BMI) is used to identify weight problems. It estimates body fat based on height and weight. Your health care provider can help determine your BMI and help you achieve or maintain a healthy weight. Get regular exercise Get regular exercise. This is one of the most important things you can do for your health. Most adults should:  Exercise for at least 150 minutes each week. The exercise should increase your heart rate and make you sweat (moderate-intensity exercise).  Do strengthening exercises at least twice a week. This is in addition to the moderate-intensity exercise.  Spend less time sitting. Even light physical activity can be beneficial. Watch cholesterol and blood lipids Have your blood tested for lipids and cholesterol at 45 years of age, then have this test every 5 years. Have your cholesterol levels checked more often if:  Your lipid or cholesterol levels are high.  You are older than 45 years of age.  You are at high risk for heart disease. What should I know about cancer screening? Depending on your health history and family history, you may need to have cancer screening at various ages. This  may include screening for:  Breast cancer.  Cervical cancer.  Colorectal cancer.  Skin cancer.  Lung cancer. What should I know about heart disease, diabetes, and high blood pressure? Blood pressure and heart disease  High blood pressure causes heart disease and increases the risk of stroke. This is more likely to develop in people who have high blood pressure readings, are of African descent, or are overweight.  Have your blood pressure checked: ? Every 3-5 years if you are 24-30 years of age. ? Every year if you are 2 years old or older. Diabetes Have regular diabetes screenings. This checks your fasting blood sugar level. Have the screening done:  Once every three years after age 50 if you are at a normal weight and have a low risk for diabetes.  More often and at a younger age if you are overweight or have a high risk for diabetes. What should I know about preventing infection? Hepatitis B If you have a higher risk for hepatitis B, you should be screened for this virus. Talk with your health care provider to find out if you are at risk for hepatitis B infection. Hepatitis C Testing is recommended for:  Everyone born from [birth year] through 1966.  Anyone with known risk factors for hepatitis C. Sexually transmitted infections (STIs)  Get screened for STIs, including gonorrhea and chlamydia, if: ? You are sexually active and are younger than 45 years of age. ? You are older than 45 years of age and your health care provider tells you that you are at  risk for this type of infection. ? Your sexual activity has changed since you were last screened, and you are at increased risk for chlamydia or gonorrhea. Ask your health care provider if you are at risk.  Ask your health care provider about whether you are at high risk for HIV. Your health care provider may recommend a prescription medicine to help prevent HIV infection. If you choose to take medicine to prevent HIV, you should  first get tested for HIV. You should then be tested every 3 months for as long as you are taking the medicine. Pregnancy  If you are about to stop having your period (premenopausal) and you may become pregnant, seek counseling before you get pregnant.  Take 400 to 800 micrograms (mcg) of folic acid every day if you become pregnant.  Ask for birth control (contraception) if you want to prevent pregnancy. Osteoporosis and menopause Osteoporosis is a disease in which the bones lose minerals and strength with aging. This can result in bone fractures. If you are 36 years old or older, or if you are at risk for osteoporosis and fractures, ask your health care provider if you should:  Be screened for bone loss.  Take a calcium or vitamin D supplement to lower your risk of fractures.  Be given hormone replacement therapy (HRT) to treat symptoms of menopause. Follow these instructions at home: Lifestyle  Do not use any products that contain nicotine or tobacco, such as cigarettes, e-cigarettes, and chewing tobacco. If you need help quitting, ask your health care provider.  Do not use street drugs.  Do not share needles.  Ask your health care provider for help if you need support or information about quitting drugs. Alcohol use  Do not drink alcohol if: ? Your health care provider tells you not to drink. ? You are pregnant, may be pregnant, or are planning to become pregnant.  If you drink alcohol: ? Limit how much you use to 0-1 drink a day. ? Limit intake if you are breastfeeding.  Be aware of how much alcohol is in your drink. In the [place] one drink equals one 12 oz bottle of beer (355 mL), one 5 oz glass of wine (148 mL), or one 1 oz glass of hard liquor (44 mL). General instructions  Schedule regular health, dental, and eye exams.  Stay current with your vaccines.  Tell your health care provider if: ? You often feel depressed. ? You have ever been abused or do not feel safe  at home. Summary  Adopting a healthy lifestyle and getting preventive care are important in promoting health and wellness.  Follow your health care provider's instructions about healthy diet, exercising, and getting tested or screened for diseases.  Follow your health care provider's instructions on monitoring your cholesterol and blood pressure. This information is not intended to replace advice given to you by your health care provider. Make sure you discuss any questions you have with your health care provider. Document Revised: 04/05/2018 Document Reviewed: 04/05/2018 Elsevier Patient Education  2020 [employer].

## 2019-05-24 LAB — CBC
Hematocrit: 34.8 % (ref 34.0–46.6)
Hemoglobin: 11.5 g/dL (ref 11.1–15.9)
MCH: 29.6 pg (ref 26.6–33.0)
MCHC: 33 g/dL (ref 31.5–35.7)
MCV: 90 fL (ref 79–97)
Platelets: 183 10*3/uL (ref 150–450)
RBC: 3.89 x10E6/uL (ref 3.77–5.28)
RDW: 13.1 % (ref 11.7–15.4)
WBC: 6.6 10*3/uL (ref 3.4–10.8)

## 2019-05-24 LAB — CMP14+EGFR
ALT: 10 IU/L (ref 0–32)
AST: 15 IU/L (ref 0–40)
Albumin/Globulin Ratio: 1.8 (ref 1.2–2.2)
Albumin: 4.5 g/dL (ref 3.8–4.8)
Alkaline Phosphatase: 46 IU/L (ref 39–117)
BUN/Creatinine Ratio: 8 — ABNORMAL LOW (ref 9–23)
BUN: 6 mg/dL (ref 6–24)
Bilirubin Total: 0.4 mg/dL (ref 0.0–1.2)
CO2: 22 mmol/L (ref 20–29)
Calcium: 8.8 mg/dL (ref 8.7–10.2)
Chloride: 104 mmol/L (ref 96–106)
Creatinine, Ser: 0.74 mg/dL (ref 0.57–1.00)
GFR calc Af Amer: 114 mL/min/{1.73_m2} (ref >59)
GFR calc non Af Amer: 99 mL/min/{1.73_m2} (ref >59)
Globulin, Total: 2.5 g/dL (ref 1.5–4.5)
Glucose: 79 mg/dL (ref 65–99)
Potassium: 3.9 mmol/L (ref 3.5–5.2)
Sodium: 137 mmol/L (ref 134–144)
Total Protein: 7 g/dL (ref 6.0–8.5)

## 2019-05-24 LAB — LIPID PANEL
Chol/HDL Ratio: 2.5 ratio (ref 0.0–4.4)
Cholesterol, Total: 160 mg/dL (ref 100–199)
HDL: 63 mg/dL (ref >39)
LDL Chol Calc (NIH): 83 mg/dL (ref 0–99)
Triglycerides: 73 mg/dL (ref 0–149)
VLDL Cholesterol Cal: 14 mg/dL (ref 5–40)

## 2019-05-24 LAB — VITAMIN D 25 HYDROXY (VIT D DEFICIENCY, FRACTURES): Vit D, 25-Hydroxy: 18.4 ng/mL — ABNORMAL LOW (ref 30.0–100.0)

## 2019-05-25 ENCOUNTER — Encounter: Payer: Self-pay | Admitting: Nurse Practitioner

## 2019-05-25 DIAGNOSIS — R238 Other skin changes: Secondary | ICD-10-CM | POA: Insufficient documentation

## 2019-06-01 ENCOUNTER — Encounter: Payer: Self-pay | Admitting: Nurse Practitioner

## 2019-10-17 LAB — HM MAMMOGRAPHY: HM Mammogram: NORMAL (ref 0–4)

## 2020-03-26 ENCOUNTER — Other Ambulatory Visit: Payer: Self-pay

## 2020-03-26 ENCOUNTER — Ambulatory Visit: Payer: 59 | Admitting: Cardiology

## 2020-03-26 ENCOUNTER — Encounter: Payer: Self-pay | Admitting: Cardiology

## 2020-03-26 VITALS — BP 143/86 | HR 75 | Ht 66.0 in | Wt 186.2 lb

## 2020-03-26 DIAGNOSIS — Z7189 Other specified counseling: Secondary | ICD-10-CM | POA: Diagnosis not present

## 2020-03-26 DIAGNOSIS — R011 Cardiac murmur, unspecified: Secondary | ICD-10-CM | POA: Diagnosis not present

## 2020-03-26 DIAGNOSIS — Z713 Dietary counseling and surveillance: Secondary | ICD-10-CM

## 2020-03-26 DIAGNOSIS — R030 Elevated blood-pressure reading, without diagnosis of hypertension: Secondary | ICD-10-CM

## 2020-03-26 DIAGNOSIS — Z7182 Exercise counseling: Secondary | ICD-10-CM

## 2020-03-26 NOTE — Patient Instructions (Addendum)
Medication Instructions:  Your Physician recommend you continue on your current medication as directed.     Lab Work: None   Testing/Procedures: None   Follow-Up: At [facility], you and your health needs are our priority.  As part of our continuing mission to provide you with exceptional heart care, we have created designated Provider Care Teams.  These Care Teams include your primary Cardiologist (physician) and Advanced Practice Providers (APPs -  Physician Assistants and Nurse Practitioners) who all work together to provide you with the care you need, when you need it.  We recommend signing up for the patient portal called "MyChart".  Sign up information is provided on this After Visit Summary.  MyChart is used to connect with patients for Virtual Visits (Telemedicine).  Patients are able to view lab/test results, encounter notes, upcoming appointments, etc.  Non-urgent messages can be sent to your provider as well.   To learn more about what you can do with MyChart, go to [email or web address].    Your next appointment:   As needed  The format for your next appointment:   In Person  Provider:   Jodelle Red, MD   Other Instructions   Mediterranean Diet A Mediterranean diet refers to food and lifestyle choices that are based on the traditions of countries located on the [place]. This way of eating has been shown to help prevent certain conditions and improve outcomes for people who have chronic diseases, like kidney disease and heart disease. What are tips for following this plan? Lifestyle  Cook and eat meals together with your family, when possible.  Drink enough fluid to keep your urine clear or pale yellow.  Be physically active every day. This includes: ? Aerobic exercise like running or swimming. ? Leisure activities like gardening, walking, or housework.  Get 7-8 hours of sleep each night.  If recommended by your health care provider,  drink red wine in moderation. This means 1 glass a day for nonpregnant women and 2 glasses a day for men. A glass of wine equals 5 oz (150 mL). Reading food labels   Check the serving size of packaged foods. For foods such as rice and pasta, the serving size refers to the amount of cooked product, not dry.  Check the total fat in packaged foods. Avoid foods that have saturated fat or trans fats.  Check the ingredients list for added sugars, such as corn syrup. Shopping  At the grocery store, buy most of your food from the areas near the walls of the store. This includes: ? Fresh fruits and vegetables (produce). ? Grains, beans, nuts, and seeds. Some of these may be available in unpackaged forms or large amounts (in bulk). ? Fresh seafood. ? Poultry and eggs. ? Low-fat dairy products.  Buy whole ingredients instead of prepackaged foods.  Buy fresh fruits and vegetables in-season from local farmers markets.  Buy frozen fruits and vegetables in resealable bags.  If you do not have access to quality fresh seafood, buy precooked frozen shrimp or canned fish, such as tuna, salmon, or sardines.  Buy small amounts of raw or cooked vegetables, salads, or olives from the deli or salad bar at your store.  Stock your pantry so you always have certain foods on hand, such as olive oil, canned tuna, canned tomatoes, rice, pasta, and beans. Cooking  Cook foods with extra-virgin olive oil instead of using butter or other vegetable oils.  Have meat as a side dish, and have vegetables or grains  as your main dish. This means having meat in small portions or adding small amounts of meat to foods like pasta or stew.  Use beans or vegetables instead of meat in common dishes like chili or lasagna.  Experiment with different cooking methods. Try roasting or broiling vegetables instead of steaming or sauteing them.  Add frozen vegetables to soups, stews, pasta, or rice.  Add nuts or seeds for added  healthy fat at each meal. You can add these to yogurt, salads, or vegetable dishes.  Marinate fish or vegetables using olive oil, lemon juice, garlic, and fresh herbs. Meal planning   Plan to eat 1 vegetarian meal one day each week. Try to work up to 2 vegetarian meals, if possible.  Eat seafood 2 or more times a week.  Have healthy snacks readily available, such as: ? Vegetable sticks with hummus. ? [place] yogurt. ? Fruit and nut trail mix.  Eat balanced meals throughout the week. This includes: ? Fruit: 2-3 servings a day ? Vegetables: 4-5 servings a day ? Low-fat dairy: 2 servings a day ? Fish, poultry, or lean meat: 1 serving a day ? Beans and legumes: 2 or more servings a week ? Nuts and seeds: 1-2 servings a day ? Whole grains: 6-8 servings a day ? Extra-virgin olive oil: 3-4 servings a day  Limit red meat and sweets to only a few servings a month What are my food choices?  Mediterranean diet ? Recommended  Grains: Whole-grain pasta. Brown rice. Bulgar wheat. Polenta. Couscous. Whole-wheat bread. Orpah Cobb.  Vegetables: Artichokes. Beets. Broccoli. Cabbage. Carrots. Eggplant. Green beans. Chard. Kale. Spinach. Onions. Leeks. Peas. Squash. Tomatoes. Peppers. Radishes.  Fruits: Apples. Apricots. Avocado. Berries. Bananas. Cherries. Dates. Figs. Grapes. Lemons. Melon. Oranges. Peaches. Plums. Pomegranate.  Meats and other protein foods: Beans. Almonds. Sunflower seeds. Pine nuts. Peanuts. Cod. Salmon. Scallops. Shrimp. Tuna. Tilapia. Clams. Oysters. Eggs.  Dairy: Low-fat milk. Cheese. Greek yogurt.  Beverages: Water. Red wine. Herbal tea.  Fats and oils: Extra virgin olive oil. Avocado oil. Grape seed oil.  Sweets and desserts: [place] yogurt with honey. Baked apples. Poached pears. Trail mix.  Seasoning and other foods: Basil. Cilantro. Coriander. Cumin. Mint. Parsley. Sage. Rosemary. Tarragon. Garlic. Oregano. Thyme. Pepper. Balsalmic vinegar. Tahini. Hummus.  Tomato sauce. Olives. Mushrooms. ? Limit these  Grains: Prepackaged pasta or rice dishes. Prepackaged cereal with added sugar.  Vegetables: Deep fried potatoes (french fries).  Fruits: Fruit canned in syrup.  Meats and other protein foods: Beef. Pork. Lamb. Poultry with skin. Hot dogs. Tomasa Blase.  Dairy: Ice cream. Sour cream. Whole milk.  Beverages: Juice. Sugar-sweetened soft drinks. Beer. Liquor and spirits.  Fats and oils: Butter. Canola oil. Vegetable oil. Beef fat (tallow). Lard.  Sweets and desserts: Cookies. Cakes. Pies. Candy.  Seasoning and other foods: Mayonnaise. Premade sauces and marinades. The items listed may not be a complete list. Talk with your dietitian about what dietary choices are right for you. Summary  The Mediterranean diet includes both food and lifestyle choices.  Eat a variety of fresh fruits and vegetables, beans, nuts, seeds, and whole grains.  Limit the amount of red meat and sweets that you eat.  Talk with your health care provider about whether it is safe for you to drink red wine in moderation. This means 1 glass a day for nonpregnant women and 2 glasses a day for men. A glass of wine equals 5 oz (150 mL). This information is not intended to replace advice given to you by  your health care provider. Make sure you discuss any questions you have with your health care provider. Document Revised: 12/11/2015 Document Reviewed: 12/04/2015 Elsevier Patient Education  2020 [employer].

## 2020-03-26 NOTE — Progress Notes (Signed)
Cardiology Office Note:    Date:  03/26/2020   ID:  Tammy Mclaughlin, DOB 1975/09/07, MRN [id number]  PCP:  Arnette Felts, FNP  Cardiologist:  Jodelle Red, MD  Referring MD: Silverio Lay, MD   CC: new patient evaluation for murmur  History of Present Illness:    Tammy Mclaughlin is a 44 y.o. female without prior cardiac history who is seen as a new consult at the request of Rivard, Dois Davenport, MD for the evaluation and management of murmur.  I do not have any available notes from Dr. Cloretta Ned office today. Last primary care note from Arnette Felts, FNP from 05/23/19 reviewed. Referral comments note a murmur.   Has never been told she has a murmur until recently.   Cardiovascular risk factors: Prior clinical ASCVD: none Comorbid conditions: Never treated for hypertension, but numbers have been elevated more recently (130-140 systolic). Denies hyperlipidemia, diabetes, chronic kidney disease Metabolic syndrome/Obesity: highest adult weight 198 lbs Chronic inflammatory conditions: none Tobacco use history: rare social cigar smoker Family history: mother passed away one month ago, had history of liver transplant in 2011, had diabetes, kidney disease, not a candidate for re-transplant when liver started to fail. Never met her father, told he passed from heart disease. No siblings. 3 children, all healthy. Mat Gma had cancer, Mat Gpa was a diabetic. No FH of sudden cardiac death. Prior cardiac testing and/or incidental findings on other testing (ie coronary calcium): none Exercise level: no limitations, but largely sedentary. Used to walk but has stopped doing this.  Current diet: hasn't eaten meat in 4 years, does eat fish/seafood. Eats vegetables, fruits, does eat a lot of junk food.  Past Medical History:  Diagnosis Date  . Abnormal Pap smear   . Anemia   . HGSIL (high grade squamous intraepithelial dysplasia)     Past Surgical History:  Procedure Laterality Date  .  CERVICAL CONIZATION W/BX  06/17/2011   Procedure: CONIZATION CERVIX WITH BIOPSY;  Surgeon: Hal Morales, MD;  Location: [facility];  Service: Gynecology;  Laterality: N/A;  . LEEP  1998  . SVD     x 3  . WISDOM TOOTH EXTRACTION      Current Medications: Current Outpatient Medications on File Prior to Visit  Medication Sig  . levonorgestrel (MIRENA) 20 MCG/24HR IUD 1 each by Intrauterine route once.   No current facility-administered medications on file prior to visit.     Allergies:   Patient has no known allergies.   Social History   Tobacco Use  . Smoking status: Never Smoker  . Smokeless tobacco: Never Used  Substance Use Topics  . Alcohol use: No  . Drug use: No    Family History: family history includes Cancer in her mother; Diabetes in her paternal grandfather and paternal grandmother; Hypertension in her maternal grandfather and maternal grandmother.  ROS:   Please see the history of present illness.  Additional pertinent ROS: Constitutional: Negative for chills, fever, night sweats, unintentional weight loss  HENT: Negative for ear pain and hearing loss.   Eyes: Negative for loss of vision and eye pain.  Respiratory: Negative for cough, sputum, wheezing.   Cardiovascular: See HPI. Gastrointestinal: Negative for abdominal pain, melena, and hematochezia.  Genitourinary: Negative for dysuria and hematuria.  Musculoskeletal: Negative for falls and myalgias.  Skin: Negative for itching and rash.  Neurological: Negative for focal weakness, focal sensory changes and loss of consciousness.  Endo/Heme/Allergies: Does not bruise/bleed easily.     EKGs/Labs/Other Studies Reviewed:  The following studies were reviewed today: No prior cardiac studies  EKG:  EKG is personally reviewed.  The ekg ordered today demonstrates NSR at 75 bpm  Recent Labs: 05/23/2019: ALT 10; BUN 6; Creatinine, Ser 0.74; Hemoglobin 11.5; Platelets 183; Potassium 3.9; Sodium 137  Recent Lipid  Panel    Component Value Date/Time   CHOL 160 05/23/2019 1016   TRIG 73 05/23/2019 1016   HDL 63 05/23/2019 1016   CHOLHDL 2.5 05/23/2019 1016   LDLCALC 83 05/23/2019 1016    Physical Exam:    VS:  BP (!) 143/86   Pulse 75   Ht _0  (1.676 m)   Wt 186 lb 3.2 oz (84.5 kg)   SpO2 100%   BMI 30.05 kg/m     Wt Readings from Last 3 Encounters:  03/26/20 186 lb 3.2 oz (84.5 kg)  05/23/19 169 lb 6.4 oz (76.8 kg)  05/17/18 153 lb (69.4 kg)    GEN: Well nourished, well developed in no acute distress HEENT: Normal, moist mucous membranes NECK: No JVD CARDIAC: regular rhythm, normal S1 and S2, no rubs or gallops. 1/6 RUSB systolic murmurs. VASCULAR: Radial and DP pulses 2+ bilaterally. No carotid bruits RESPIRATORY:  Clear to auscultation without rales, wheezing or rhonchi  ABDOMEN: Soft, non-tender, non-distended MUSCULOSKELETAL:  Ambulates independently SKIN: Warm and dry, no edema NEUROLOGIC:  Alert and oriented x 3. No focal neuro deficits noted. PSYCHIATRIC:  Normal affect    ASSESSMENT:    1. Nutritional counseling   2. Murmur, cardiac   3. Single episode of elevated blood pressure   4. Cardiac risk counseling   5. Counseling on health promotion and disease prevention   6. Exercise counseling    PLAN:    Murmur, systolic: -given that it is only 1/6, and she is asymptomatic, no indications for echocardiogram  Elevated blood pressure: denies prior history of hypertension -should follow up with PCP, if remains elevated may need to start medical therapy -counseled on lifestyle recommendations, salt avoidance  Cardiac risk counseling and prevention recommendations: -recommend heart healthy/Mediterranean diet, with whole grains, fruits, vegetable, fish, lean meats, nuts, and olive oil. Limit salt. -recommend moderate walking, 3-5 times/week for 30-50 minutes each session. Aim for at least 150 minutes.week. Goal should be pace of 3 miles/hours, or walking 1.5 miles in 30  minutes -recommend avoidance of tobacco products. Avoid excess alcohol. -ASCVD risk score: The 10-year ASCVD risk score Mikey Bussing DC Brooke Bonito., et al., 2013) is: 1.1%   Values used to calculate the score:     Age: 14 years     Sex: Female     Is Non-Hispanic African American: Yes     Diabetic: No     Tobacco smoker: No     Systolic Blood Pressure: 151 mmHg     Is BP treated: No     HDL Cholesterol: 63 mg/dL     Total Cholesterol: 160 mg/dL    Plan for follow up: I would be happy to see her back as needed  Buford Dresser, MD, PhD [place]  [facility]    Medication Adjustments/Labs and Tests Ordered: Current medicines are reviewed at length with the patient today.  Concerns regarding medicines are outlined above.  Orders Placed This Encounter  Procedures  . EKG 12-Lead   No orders of the defined types were placed in this encounter.   Patient Instructions  Medication Instructions:  Your Physician recommend you continue on your current medication as directed.     Lab  Work: None   Testing/Procedures: None   Follow-Up: At [facility], you and your health needs are our priority.  As part of our continuing mission to provide you with exceptional heart care, we have created designated Provider Care Teams.  These Care Teams include your primary Cardiologist (physician) and Advanced Practice Providers (APPs -  Physician Assistants and Nurse Practitioners) who all work together to provide you with the care you need, when you need it.  We recommend signing up for the patient portal called "MyChart".  Sign up information is provided on this After Visit Summary.  MyChart is used to connect with patients for Virtual Visits (Telemedicine).  Patients are able to view lab/test results, encounter notes, upcoming appointments, etc.  Non-urgent messages can be sent to your provider as well.   To learn more about what you can do with MyChart, go to [email or web address].    Your  next appointment:   As needed  The format for your next appointment:   In Person  Provider:   Buford Dresser, MD   Other Instructions   [place] refers to food and lifestyle choices that are based on the traditions of countries located on the [place]. This way of eating has been shown to help prevent certain conditions and improve outcomes for people who have chronic diseases, like kidney disease and heart disease. What are tips for following this plan? Lifestyle  Cook and eat meals together with your family, when possible.  Drink enough fluid to keep your urine clear or pale yellow.  Be physically active every day. This includes: ? Aerobic exercise like running or swimming. ? Leisure activities like gardening, walking, or housework.  Get 7-8 hours of sleep each night.  If recommended by your health care provider, drink red wine in moderation. This means 1 glass a day for nonpregnant women and 2 glasses a day for men. A glass of wine equals 5 oz (150 mL). Reading food labels   Check the serving size of packaged foods. For foods such as rice and pasta, the serving size refers to the amount of cooked product, not dry.  Check the total fat in packaged foods. Avoid foods that have saturated fat or trans fats.  Check the ingredients list for added sugars, such as corn syrup. Shopping  At the grocery store, buy most of your food from the areas near the walls of the store. This includes: ? Fresh fruits and vegetables (produce). ? Grains, beans, nuts, and seeds. Some of these may be available in unpackaged forms or large amounts (in bulk). ? Fresh seafood. ? Poultry and eggs. ? Low-fat dairy products.  Buy whole ingredients instead of prepackaged foods.  Buy fresh fruits and vegetables in-season from local farmers markets.  Buy frozen fruits and vegetables in resealable bags.  If you do not have access to quality fresh  seafood, buy precooked frozen shrimp or canned fish, such as tuna, salmon, or sardines.  Buy small amounts of raw or cooked vegetables, salads, or olives from the deli or salad bar at your store.  Stock your pantry so you always have certain foods on hand, such as olive oil, canned tuna, canned tomatoes, rice, pasta, and beans. Cooking  Cook foods with extra-virgin olive oil instead of using butter or other vegetable oils.  Have meat as a side dish, and have vegetables or grains as your main dish. This means having meat in small portions or adding small amounts of meat to  foods like pasta or stew.  Use beans or vegetables instead of meat in common dishes like chili or lasagna.  Experiment with different cooking methods. Try roasting or broiling vegetables instead of steaming or sauteing them.  Add frozen vegetables to soups, stews, pasta, or rice.  Add nuts or seeds for added healthy fat at each meal. You can add these to yogurt, salads, or vegetable dishes.  Marinate fish or vegetables using olive oil, lemon juice, garlic, and fresh herbs. Meal planning   Plan to eat 1 vegetarian meal one day each week. Try to work up to 2 vegetarian meals, if possible.  Eat seafood 2 or more times a week.  Have healthy snacks readily available, such as: ? Vegetable sticks with hummus. ? [place] yogurt. ? Fruit and nut trail mix.  Eat balanced meals throughout the week. This includes: ? Fruit: 2-3 servings a day ? Vegetables: 4-5 servings a day ? Low-fat dairy: 2 servings a day ? Fish, poultry, or lean meat: 1 serving a day ? Beans and legumes: 2 or more servings a week ? Nuts and seeds: 1-2 servings a day ? Whole grains: 6-8 servings a day ? Extra-virgin olive oil: 3-4 servings a day  Limit red meat and sweets to only a few servings a month What are my food choices?  Mediterranean diet ? Recommended  Grains: Whole-grain pasta. Brown rice. Bulgar wheat. Polenta. Couscous. Whole-wheat  bread. [place].  Vegetables: Artichokes. Beets. Broccoli. Cabbage. Carrots. Eggplant. Green beans. Chard. Kale. Spinach. Onions. Leeks. Peas. Squash. Tomatoes. Peppers. Radishes.  Fruits: Apples. Apricots. Avocado. Berries. Bananas. Cherries. Dates. Figs. Grapes. Lemons. Melon. Oranges. Peaches. Plums. Pomegranate.  Meats and other protein foods: Beans. Almonds. Sunflower seeds. Pine nuts. Peanuts. [place]. Salmon. Scallops. Shrimp. [place]. Tilapia. Clams. Oysters. Eggs.  Dairy: Low-fat milk. Cheese. Greek yogurt.  Beverages: Water. Red wine. Herbal tea.  Fats and oils: Extra virgin olive oil. Avocado oil. Grape seed oil.  Sweets and desserts: [place] yogurt with honey. Baked apples. Poached pears. Trail mix.  Seasoning and other foods: Basil. Cilantro. Coriander. Cumin. Mint. Parsley. Sage. Rosemary. Tarragon. Garlic. Oregano. Thyme. Pepper. Balsalmic vinegar. Tahini. Hummus. Tomato sauce. Olives. Mushrooms. ? Limit these  Grains: Prepackaged pasta or rice dishes. Prepackaged cereal with added sugar.  Vegetables: Deep fried potatoes (french fries).  Fruits: Fruit canned in syrup.  Meats and other protein foods: Beef. Pork. Lamb. Poultry with skin. Hot dogs. [place] Salines.  Dairy: Ice cream. Sour cream. Whole milk.  Beverages: Juice. Sugar-sweetened soft drinks. Beer. Liquor and spirits.  Fats and oils: Butter. Canola oil. Vegetable oil. Beef fat (tallow). Lard.  Sweets and desserts: Cookies. Cakes. Pies. Candy.  Seasoning and other foods: Mayonnaise. Premade sauces and marinades. The items listed may not be a complete list. Talk with your dietitian about what dietary choices are right for you. Summary  The Mediterranean diet includes both food and lifestyle choices.  Eat a variety of fresh fruits and vegetables, beans, nuts, seeds, and whole grains.  Limit the amount of red meat and sweets that you eat.  Talk with your health care provider about whether it is safe for you to  drink red wine in moderation. This means 1 glass a day for nonpregnant women and 2 glasses a day for men. A glass of wine equals 5 oz (150 mL). This information is not intended to replace advice given to you by your health care provider. Make sure you discuss any questions you have with your health care provider. Document  Revised: 12/11/2015 Document Reviewed: 12/04/2015 Elsevier Patient Education  2020 [employer].    Signed, Buford Dresser, MD PhD 03/26/2020     [place]

## 2020-04-24 ENCOUNTER — Encounter: Payer: Self-pay | Admitting: Nurse Practitioner

## 2020-04-24 ENCOUNTER — Other Ambulatory Visit: Payer: Self-pay | Admitting: Obstetrics and Gynecology

## 2020-04-24 DIAGNOSIS — N63 Unspecified lump in unspecified breast: Secondary | ICD-10-CM

## 2020-04-24 LAB — HM MAMMOGRAPHY

## 2020-05-13 ENCOUNTER — Other Ambulatory Visit: Payer: 59

## 2020-05-26 ENCOUNTER — Ambulatory Visit
Admission: RE | Admit: 2020-05-26 | Discharge: 2020-05-26 | Disposition: A | Discharge status: Home / Self Care | Payer: 59 | Source: Ambulatory Visit | Attending: Obstetrics and Gynecology | Admitting: Obstetrics and Gynecology

## 2020-05-26 ENCOUNTER — Encounter: Payer: Self-pay | Admitting: Nurse Practitioner

## 2020-05-26 ENCOUNTER — Other Ambulatory Visit (HOSPITAL_COMMUNITY): Payer: Self-pay | Admitting: Diagnostic Radiology

## 2020-05-26 ENCOUNTER — Other Ambulatory Visit: Payer: Self-pay

## 2020-05-26 ENCOUNTER — Ambulatory Visit (INDEPENDENT_AMBULATORY_CARE_PROVIDER_SITE_OTHER): Payer: 59 | Admitting: Nurse Practitioner

## 2020-05-26 VITALS — BP 136/80 | HR 86 | Temp 98.2°F | Ht 66.0 in | Wt 188.4 lb

## 2020-05-26 DIAGNOSIS — Z1159 Encounter for screening for other viral diseases: Secondary | ICD-10-CM | POA: Diagnosis not present

## 2020-05-26 DIAGNOSIS — Z1211 Encounter for screening for malignant neoplasm of colon: Secondary | ICD-10-CM

## 2020-05-26 DIAGNOSIS — N6310 Unspecified lump in the right breast, unspecified quadrant: Secondary | ICD-10-CM

## 2020-05-26 DIAGNOSIS — Z23 Encounter for immunization: Secondary | ICD-10-CM | POA: Diagnosis not present

## 2020-05-26 DIAGNOSIS — Z0000 Encounter for general adult medical examination without abnormal findings: Secondary | ICD-10-CM | POA: Diagnosis not present

## 2020-05-26 DIAGNOSIS — N63 Unspecified lump in unspecified breast: Secondary | ICD-10-CM

## 2020-05-26 DIAGNOSIS — E559 Vitamin D deficiency, unspecified: Secondary | ICD-10-CM | POA: Diagnosis not present

## 2020-05-26 NOTE — Patient Instructions (Signed)
Health Maintenance, Female Adopting a healthy lifestyle and getting preventive care are important in promoting health and wellness. Ask your health care provider about:  The right schedule for you to have regular tests and exams.  Things you can do on your own to prevent diseases and keep yourself healthy. What should I know about diet, weight, and exercise? Eat a healthy diet  Eat a diet that includes plenty of vegetables, fruits, low-fat dairy products, and lean protein.  Do not eat a lot of foods that are high in solid fats, added sugars, or sodium.   Maintain a healthy weight Body mass index (BMI) is used to identify weight problems. It estimates body fat based on height and weight. Your health care provider can help determine your BMI and help you achieve or maintain a healthy weight. Get regular exercise Get regular exercise. This is one of the most important things you can do for your health. Most adults should:  Exercise for at least 150 minutes each week. The exercise should increase your heart rate and make you sweat (moderate-intensity exercise).  Do strengthening exercises at least twice a week. This is in addition to the moderate-intensity exercise.  Spend less time sitting. Even light physical activity can be beneficial. Watch cholesterol and blood lipids Have your blood tested for lipids and cholesterol at 46 years of age, then have this test every 5 years. Have your cholesterol levels checked more often if:  Your lipid or cholesterol levels are high.  You are older than 46 years of age.  You are at high risk for heart disease. What should I know about cancer screening? Depending on your health history and family history, you may need to have cancer screening at various ages. This may include screening for:  Breast cancer.  Cervical cancer.  Colorectal cancer.  Skin cancer.  Lung cancer. What should I know about heart disease, diabetes, and high blood  pressure? Blood pressure and heart disease  High blood pressure causes heart disease and increases the risk of stroke. This is more likely to develop in people who have high blood pressure readings, are of African descent, or are overweight.  Have your blood pressure checked: ? Every 3-5 years if you are 18-39 years of age. ? Every year if you are 40 years old or older. Diabetes Have regular diabetes screenings. This checks your fasting blood sugar level. Have the screening done:  Once every three years after age 40 if you are at a normal weight and have a low risk for diabetes.  More often and at a younger age if you are overweight or have a high risk for diabetes. What should I know about preventing infection? Hepatitis B If you have a higher risk for hepatitis B, you should be screened for this virus. Talk with your health care provider to find out if you are at risk for hepatitis B infection. Hepatitis C Testing is recommended for:  Everyone born from 1945 through 1965.  Anyone with known risk factors for hepatitis C. Sexually transmitted infections (STIs)  Get screened for STIs, including gonorrhea and chlamydia, if: ? You are sexually active and are younger than 46 years of age. ? You are older than 46 years of age and your health care provider tells you that you are at risk for this type of infection. ? Your sexual activity has changed since you were last screened, and you are at increased risk for chlamydia or gonorrhea. Ask your health care provider   if you are at risk.  Ask your health care provider about whether you are at high risk for HIV. Your health care provider may recommend a prescription medicine to help prevent HIV infection. If you choose to take medicine to prevent HIV, you should first get tested for HIV. You should then be tested every 3 months for as long as you are taking the medicine. Pregnancy  If you are about to stop having your period (premenopausal) and  you may become pregnant, seek counseling before you get pregnant.  Take 400 to 800 micrograms (mcg) of folic acid every day if you become pregnant.  Ask for birth control (contraception) if you want to prevent pregnancy. Osteoporosis and menopause Osteoporosis is a disease in which the bones lose minerals and strength with aging. This can result in bone fractures. If you are 65 years old or older, or if you are at risk for osteoporosis and fractures, ask your health care provider if you should:  Be screened for bone loss.  Take a calcium or vitamin D supplement to lower your risk of fractures.  Be given hormone replacement therapy (HRT) to treat symptoms of menopause. Follow these instructions at home: Lifestyle  Do not use any products that contain nicotine or tobacco, such as cigarettes, e-cigarettes, and chewing tobacco. If you need help quitting, ask your health care provider.  Do not use street drugs.  Do not share needles.  Ask your health care provider for help if you need support or information about quitting drugs. Alcohol use  Do not drink alcohol if: ? Your health care provider tells you not to drink. ? You are pregnant, may be pregnant, or are planning to become pregnant.  If you drink alcohol: ? Limit how much you use to 0-1 drink a day. ? Limit intake if you are breastfeeding.  Be aware of how much alcohol is in your drink. In the [place] one drink equals one 12 oz bottle of beer (355 mL), one 5 oz glass of wine (148 mL), or one 1 oz glass of hard liquor (44 mL). General instructions  Schedule regular health, dental, and eye exams.  Stay current with your vaccines.  Tell your health care provider if: ? You often feel depressed. ? You have ever been abused or do not feel safe at home. Summary  Adopting a healthy lifestyle and getting preventive care are important in promoting health and wellness.  Follow your health care provider's instructions about healthy  diet, exercising, and getting tested or screened for diseases.  Follow your health care provider's instructions on monitoring your cholesterol and blood pressure. This information is not intended to replace advice given to you by your health care provider. Make sure you discuss any questions you have with your health care provider. Document Revised: 04/05/2018 Document Reviewed: 04/05/2018 Elsevier Patient Education  2021 Elsevier Inc.  

## 2020-05-26 NOTE — Progress Notes (Signed)
I,Yamilka Roman [employer] as a Education administrator for [employer], FNP.,have documented all relevant documentation on the behalf of Minette Brine, FNP,as directed by  Minette Brine, FNP while in the presence of Minette Brine, [place]. This visit occurred during the SARS-CoV-2 public health emergency.  Safety protocols were in place, including screening questions prior to the visit, additional usage of staff PPE, and extensive cleaning of exam room while observing appropriate contact time as indicated for disinfecting solutions.  Subjective:     Patient ID: Tammy Mclaughlin , female    DOB: 19-Aug-1975 , 45 y.o.   MRN: [id number]   Chief Complaint  Patient presents with  . Annual Exam    HPI  Here for hm.  She had a biopsy on her right breast.  She has her GYN care at [facility].  Unknown cancer history on her father's side.  Her mother passed away in 2020/02/13.  She was referred to   [facility] Readings from Last 3 Encounters: 05/26/20 : 188 lb 6.4 oz (85.5 kg) 03/26/20 : 186 lb 3.2 oz (84.5 kg) 05/23/19 : 169 lb 6.4 oz (76.8 kg)     Past Medical History:  Diagnosis Date  . Abnormal Pap smear   . Anemia   . HGSIL (high grade squamous intraepithelial dysplasia)      Family History  Problem Relation Age of Onset  . Diabetes Paternal Grandfather   . Diabetes Paternal Grandmother   . Hypertension Maternal Grandmother   . Hypertension Maternal Grandfather   . Cancer Mother      Current Outpatient Medications:  .  levonorgestrel (MIRENA) 20 MCG/24HR IUD, 1 each by Intrauterine route once., Disp: , Rfl:  .  Vitamin D, Ergocalciferol, (DRISDOL) 1.25 MG (50000 UNIT) CAPS capsule, Take 1 capsule (50,000 Units total) by mouth every 7 (seven) days., Disp: 12 capsule, Rfl: 0   No Known Allergies    The patient states she uses IUD for birth control.  No LMP recorded. (Menstrual status: IUD).. Negative for Dysmenorrhea and Negative for Menorrhagia. Negative for: breast discharge, breast  lump(s), breast pain and breast self exam. Associated symptoms include abnormal vaginal bleeding. Pertinent negatives include abnormal bleeding (hematology), anxiety, decreased libido, depression, difficulty falling sleep, dyspareunia, history of infertility, nocturia, sexual dysfunction, sleep disturbances, urinary incontinence, urinary urgency, vaginal discharge and vaginal itching. Diet regular. The patient states her exercise level is moderate   . The patient's tobacco use is:  Social History   Tobacco Use  Smoking Status Never Smoker  Smokeless Tobacco Never Used  . She has been exposed to passive smoke. The patient's alcohol use is:  Social History   Substance and Sexual Activity  Alcohol Use No  . Additional information: Last pap 03/18/2020, next one scheduled for 03/19/2023  Review of Systems  Constitutional: Negative.   HENT: Negative.   Eyes: Negative.   Respiratory: Negative.   Cardiovascular: Negative.   Gastrointestinal: Negative.   Endocrine: Negative.   Genitourinary: Negative.   Musculoskeletal: Negative.   Skin: Negative.   Allergic/Immunologic: Negative.   Neurological: Negative.   Hematological: Negative.   Psychiatric/Behavioral: Negative.      Today's Vitals   05/26/20 0940  BP: 136/80  Pulse: 86  Temp: 98.2 F (36.8 C)  TempSrc: Oral  Weight: 188 lb 6.4 oz (85.5 kg)  Height: _0  (1.676 m)  PainSc: 0-No pain   Body mass index is 30.41 kg/m.   Objective:  Physical Exam Constitutional:      General: She is  not in acute distress.    Appearance: Normal appearance. She is well-developed. She is obese.  HENT:     Head: Normocephalic and atraumatic.     Right Ear: Hearing, tympanic membrane, ear canal and external ear normal. There is no impacted cerumen.     Left Ear: Hearing, tympanic membrane, ear canal and external ear normal. There is no impacted cerumen.     Nose:     Comments: Deferred - masked    Mouth/Throat:     Comments: Deferred -  masked Eyes:     General: Lids are normal.     Extraocular Movements: Extraocular movements intact.     Conjunctiva/sclera: Conjunctivae normal.     Pupils: Pupils are equal, round, and reactive to light.     Funduscopic exam:    Right eye: No papilledema.        Left eye: No papilledema.  Neck:     Thyroid: No thyroid mass.     Vascular: No carotid bruit.  Cardiovascular:     Rate and Rhythm: Normal rate and regular rhythm.     Pulses: Normal pulses.     Heart sounds: Normal heart sounds. No murmur heard.   Pulmonary:     Effort: Pulmonary effort is normal. No respiratory distress.     Breath sounds: Normal breath sounds.  Chest:     Chest wall: No mass.  Breasts:     Tanner Score is 5.     Right: Normal. No mass, tenderness, axillary adenopathy or supraclavicular adenopathy.     Left: Normal. No mass, tenderness, axillary adenopathy or supraclavicular adenopathy.    Abdominal:     General: Abdomen is flat. Bowel sounds are normal. There is no distension.     Palpations: Abdomen is soft.     Tenderness: There is no abdominal tenderness.  Genitourinary:    Rectum: Guaiac result negative.  Musculoskeletal:        General: No swelling. Normal range of motion.     Cervical back: Full passive range of motion without pain, normal range of motion and neck supple.     Right lower leg: No edema.     Left lower leg: No edema.  Lymphadenopathy:     Upper Body:     Right upper body: No supraclavicular, axillary or pectoral adenopathy.     Left upper body: No supraclavicular, axillary or pectoral adenopathy.  Skin:    General: Skin is warm and dry.     Capillary Refill: Capillary refill takes less than 2 seconds.     Comments: Right anterior shin with slight raised vesicle with peeling skin and left anterior shin with papular vesicle  Neurological:     General: No focal deficit present.     Mental Status: She is alert and oriented to person, place, and time.     Cranial Nerves:  No cranial nerve deficit.     Sensory: No sensory deficit.  Psychiatric:        Mood and Affect: Mood normal.        Behavior: Behavior normal.        Thought Content: Thought content normal.        Judgment: Judgment normal.         Assessment And Plan:     1. Encounter for general adult medical examination w/o abnormal findings . Behavior modifications discussed and diet history reviewed.   . Pt will continue to exercise regularly and modify diet with low GI, plant based  foods and decrease intake of processed foods.  . Recommend intake of daily multivitamin, Vitamin D, and calcium.  . Recommend mammogram (had biopsy this morning) and colonoscopy for preventive screenings, as well as recommend immunizations that include influenza, TDAP  - CMP14+EGFR - CBC - Lipid panel  2. Need for influenza vaccination  Influenza vaccine administered  Encouraged to take Tylenol as needed for fever or muscle aches. - Flu Vaccine QUAD 6+ mos PF IM (Fluarix Quad PF)  3. Encounter for hepatitis C screening test for low risk patient Will check Hepatitis C screening due to recent recommendations to screen all adults 18 years and older - Hepatitis C antibody  4. Breast mass, right  Being followed by GYN, had done this morning  5. Vitamin D deficiency  Will check vitamin D level and supplement as needed.     Also encouraged to spend 15 minutes in the sun daily.  - VITAMIN D 25 Hydroxy (Vit-D Deficiency, Fractures)  6. Encounter for screening colonoscopy  According to USPTF Colorectal cancer Screening guidelines. Colonoscopy is recommended every 10 years, starting at age 35years.  Will refer to GI for colon cancer screening. - Ambulatory referral to Gastroenterology    Patient was given opportunity to ask questions. Patient verbalized understanding of the plan and was able to repeat key elements of the plan. All questions were answered to their satisfaction.   Minette Brine, FNP   I,  Minette Brine, FNP, have reviewed all documentation for this visit. The documentation on 05/26/20 for the exam, diagnosis, procedures, and orders are all accurate and complete.   THE PATIENT IS ENCOURAGED TO PRACTICE SOCIAL DISTANCING DUE TO THE COVID-19 PANDEMIC.

## 2020-05-27 ENCOUNTER — Encounter: Payer: Self-pay | Admitting: Nurse Practitioner

## 2020-05-27 ENCOUNTER — Other Ambulatory Visit: Payer: Self-pay | Admitting: Nurse Practitioner

## 2020-05-27 DIAGNOSIS — E559 Vitamin D deficiency, unspecified: Secondary | ICD-10-CM

## 2020-05-27 LAB — CMP14+EGFR
ALT: 10 IU/L (ref 0–32)
AST: 16 IU/L (ref 0–40)
Albumin/Globulin Ratio: 1.5 (ref 1.2–2.2)
Albumin: 4.5 g/dL (ref 3.8–4.8)
Alkaline Phosphatase: 56 IU/L (ref 44–121)
BUN/Creatinine Ratio: 11 (ref 9–23)
BUN: 9 mg/dL (ref 6–24)
Bilirubin Total: 0.2 mg/dL (ref 0.0–1.2)
CO2: 21 mmol/L (ref 20–29)
Calcium: 9.2 mg/dL (ref 8.7–10.2)
Chloride: 104 mmol/L (ref 96–106)
Creatinine, Ser: 0.8 mg/dL (ref 0.57–1.00)
GFR calc Af Amer: 103 mL/min/{1.73_m2} (ref >59)
GFR calc non Af Amer: 89 mL/min/{1.73_m2} (ref >59)
Globulin, Total: 3 g/dL (ref 1.5–4.5)
Glucose: 87 mg/dL (ref 65–99)
Potassium: 4.1 mmol/L (ref 3.5–5.2)
Sodium: 141 mmol/L (ref 134–144)
Total Protein: 7.5 g/dL (ref 6.0–8.5)

## 2020-05-27 LAB — CBC
Hematocrit: 35.4 % (ref 34.0–46.6)
Hemoglobin: 11.8 g/dL (ref 11.1–15.9)
MCH: 29.3 pg (ref 26.6–33.0)
MCHC: 33.3 g/dL (ref 31.5–35.7)
MCV: 88 fL (ref 79–97)
Platelets: 252 10*3/uL (ref 150–450)
RBC: 4.03 x10E6/uL (ref 3.77–5.28)
RDW: 13.2 % (ref 11.7–15.4)
WBC: 8.8 10*3/uL (ref 3.4–10.8)

## 2020-05-27 LAB — LIPID PANEL
Chol/HDL Ratio: 3.2 ratio (ref 0.0–4.4)
Cholesterol, Total: 175 mg/dL (ref 100–199)
HDL: 55 mg/dL (ref >39)
LDL Chol Calc (NIH): 95 mg/dL (ref 0–99)
Triglycerides: 142 mg/dL (ref 0–149)
VLDL Cholesterol Cal: 25 mg/dL (ref 5–40)

## 2020-05-27 LAB — VITAMIN D 25 HYDROXY (VIT D DEFICIENCY, FRACTURES): Vit D, 25-Hydroxy: 24.7 ng/mL — ABNORMAL LOW (ref 30.0–100.0)

## 2020-05-27 LAB — HEPATITIS C ANTIBODY: Hep C Virus Ab: <0.1 s/co ratio (ref 0.0–0.9)

## 2020-05-27 MED ORDER — VITAMIN D (ERGOCALCIFEROL) 1.25 MG (50000 UNIT) PO CAPS: 50000.0000 [IU] | ORAL_CAPSULE | ORAL | 0 refills | Status: DC

## 2020-05-30 ENCOUNTER — Encounter: Payer: Self-pay | Admitting: Nurse Practitioner

## 2020-06-02 ENCOUNTER — Encounter: Payer: Self-pay | Admitting: Nurse Practitioner

## 2020-06-12 ENCOUNTER — Ambulatory Visit: Payer: Self-pay | Admitting: General Surgery

## 2020-06-12 DIAGNOSIS — N6310 Unspecified lump in the right breast, unspecified quadrant: Secondary | ICD-10-CM

## 2020-06-16 ENCOUNTER — Encounter: Payer: Self-pay | Admitting: Nurse Practitioner

## 2020-06-27 ENCOUNTER — Encounter: Payer: Self-pay | Admitting: Internal Medicine

## 2020-06-27 ENCOUNTER — Encounter: Payer: Self-pay | Admitting: Nurse Practitioner

## 2020-07-07 ENCOUNTER — Encounter: Payer: Self-pay | Admitting: Cardiology

## 2020-08-26 ENCOUNTER — Telehealth: Payer: Self-pay

## 2020-08-26 NOTE — Telephone Encounter (Signed)
Called pt d/t NS for previsit.  Pt states she wants to cancel her procedure since she will be out of town.  Stated she would call back when she is able to reschedule.  PV and procedure cancelled by pt's request.

## 2020-09-09 ENCOUNTER — Encounter: Payer: 59 | Admitting: Internal Medicine

## 2020-10-23 ENCOUNTER — Ambulatory Visit: Payer: 59 | Admitting: Nurse Practitioner

## 2021-04-23 LAB — HM PAP SMEAR: HM Pap smear: NORMAL

## 2021-05-27 ENCOUNTER — Encounter: Payer: 59 | Admitting: Nurse Practitioner

## 2021-06-04 ENCOUNTER — Ambulatory Visit (INDEPENDENT_AMBULATORY_CARE_PROVIDER_SITE_OTHER): Payer: 59 | Admitting: Nurse Practitioner

## 2021-06-04 ENCOUNTER — Encounter: Payer: Self-pay | Admitting: Nurse Practitioner

## 2021-06-04 ENCOUNTER — Other Ambulatory Visit: Payer: Self-pay

## 2021-06-04 VITALS — BP 122/68 | HR 75 | Temp 98.1°F | Ht 66.0 in | Wt 182.2 lb

## 2021-06-04 DIAGNOSIS — Z0000 Encounter for general adult medical examination without abnormal findings: Secondary | ICD-10-CM

## 2021-06-04 DIAGNOSIS — Z13228 Encounter for screening for other metabolic disorders: Secondary | ICD-10-CM | POA: Diagnosis not present

## 2021-06-04 DIAGNOSIS — Z6829 Body mass index (BMI) 29.0-29.9, adult: Secondary | ICD-10-CM

## 2021-06-04 DIAGNOSIS — E559 Vitamin D deficiency, unspecified: Secondary | ICD-10-CM

## 2021-06-04 NOTE — Progress Notes (Signed)
°I,Tianna Badgett,acting as a scribe for Janece Moore, FNP.,have documented all relevant documentation on the behalf of Janece Moore, FNP,as directed by  Janece Moore, FNP while in the presence of Janece Moore, FNP. ° °This visit occurred during the SARS-CoV-2 public health emergency.  Safety protocols were in place, including screening questions prior to the visit, additional usage of staff PPE, and extensive cleaning of exam room while observing appropriate contact time as indicated for disinfecting solutions. ° °Subjective:  °  ° Patient ID: Tammy Mclaughlin , female    DOB: 04/03/1976 , 45 y.o.   MRN: [id number] ° ° °Chief Complaint  °Patient presents with  ° Annual Exam  ° ° °HPI ° °Here for hm.  She has her GYN care at [facility]. She is scheduled for a consultation for her colonoscopy.  ° °Wt Readings from Last 3 Encounters: °06/04/21 : 182 lb 3.2 oz (82.6 kg) °05/26/20 : 188 lb 6.4 oz (85.5 kg) °03/26/20 : 186 lb 3.2 oz (84.5 kg) ° °BP Readings from Last 3 Encounters: °06/04/21 : 122/68 °05/26/20 : 136/80 °03/26/20 : (!) 143/86 °  ° °Past Medical History:  °Diagnosis Date  ° Abnormal Pap smear   ° Anemia   ° HGSIL (high grade squamous intraepithelial dysplasia)   °  ° °Family History  °Problem Relation Age of Onset  ° Diabetes Paternal Grandfather   ° Diabetes Paternal Grandmother   ° Hypertension Maternal Grandmother   ° Hypertension Maternal Grandfather   ° Cancer Mother   ° ° ° °Current Outpatient Medications:  °  levonorgestrel (MIRENA) 20 MCG/24HR IUD, 1 each by Intrauterine route once., Disp: , Rfl:   ° °No Known Allergies  ° ° °The patient states she uses IUD for birth control.  Patient's last menstrual period was 05/25/2021 (exact date).. Negative for Dysmenorrhea and Negative for Menorrhagia. Negative for: breast discharge, breast lump(s), breast pain and breast self exam. Associated symptoms include abnormal vaginal bleeding. Pertinent negatives include abnormal bleeding (hematology),  anxiety, decreased libido, depression, difficulty falling sleep, dyspareunia, history of infertility, nocturia, sexual dysfunction, sleep disturbances, urinary incontinence, urinary urgency, vaginal discharge and vaginal itching. Diet Pescatarian. The patient states her exercise level is none.  ° °The patient's tobacco use is:  °Social History  ° °Tobacco Use  °Smoking Status Never  °Smokeless Tobacco Never  ° °She has been exposed to passive smoke. The patient's alcohol use is:  °Social History  ° °Substance and Sexual Activity  °Alcohol Use No  ° °Additional information: Last pap 04/23/2021, next one scheduled for 04/23/2022 due to history of abnormal PAPs.   ° °Review of Systems  °Constitutional: Negative.   °HENT: Negative.    °Eyes: Negative.   °Respiratory: Negative.    °Cardiovascular: Negative.   °Gastrointestinal: Negative.   °Endocrine: Negative.   °Genitourinary: Negative.   °Musculoskeletal: Negative.   °Skin: Negative.   °Allergic/Immunologic: Negative.   °Neurological: Negative.   °Hematological: Negative.   °Psychiatric/Behavioral: Negative.     ° °Today's Vitals  ° 06/04/21 1510  °BP: 122/68  °Pulse: 75  °Temp: 98.1 °F (36.7 °C)  °TempSrc: Oral  °Weight: 182 lb 3.2 oz (82.6 kg)  °Height: 5' 6" (1.676 m)  ° °Body mass index is 29.41 kg/m².  °Wt Readings from Last 3 Encounters:  °06/04/21 182 lb 3.2 oz (82.6 kg)  °05/26/20 188 lb 6.4 oz (85.5 kg)  °03/26/20 186 lb 3.2 oz (84.5 kg)  ° ° °Objective:  °Physical Exam °Vitals reviewed.  °Constitutional:   °     General: She is not in acute distress.    Appearance: Normal appearance. She is well-developed.  HENT:     Head: Normocephalic and atraumatic.     Right Ear: Hearing, tympanic membrane, ear canal and external ear normal. There is no impacted cerumen.     Left Ear: Hearing, tympanic membrane, ear canal and external ear normal. There is no impacted cerumen.     Nose:     Comments: Deferred - masked    Mouth/Throat:     Comments: Deferred -  masked Eyes:     General: Lids are normal.     Extraocular Movements: Extraocular movements intact.     Conjunctiva/sclera: Conjunctivae normal.     Pupils: Pupils are equal, round, and reactive to light.     Funduscopic exam:    Right eye: No papilledema.        Left eye: No papilledema.  Neck:     Thyroid: No thyroid mass.     Vascular: No carotid bruit.  Cardiovascular:     Rate and Rhythm: Normal rate and regular rhythm.     Pulses: Normal pulses.     Heart sounds: Normal heart sounds. No murmur heard. Pulmonary:     Effort: Pulmonary effort is normal. No respiratory distress.     Breath sounds: Normal breath sounds. No wheezing.  Chest:     Chest wall: No mass.  Breasts:    Tanner Score is 5.     Right: Normal. No mass or tenderness.     Left: Normal. No mass or tenderness.  Abdominal:     General: Abdomen is flat. Bowel sounds are normal. There is no distension.     Palpations: Abdomen is soft.     Tenderness: There is no abdominal tenderness.  Genitourinary:    Comments: Deferred - followed by Gyn Musculoskeletal:        General: No swelling or tenderness. Normal range of motion.     Cervical back: Full passive range of motion without pain, normal range of motion and neck supple.     Right lower leg: No edema.     Left lower leg: No edema.  Lymphadenopathy:     Upper Body:     Right upper body: No supraclavicular, axillary or pectoral adenopathy.     Left upper body: No supraclavicular, axillary or pectoral adenopathy.  Skin:    General: Skin is warm and dry.     Capillary Refill: Capillary refill takes less than 2 seconds.  Neurological:     General: No focal deficit present.     Mental Status: She is alert and oriented to person, place, and time.     Cranial Nerves: No cranial nerve deficit.     Sensory: No sensory deficit.     Motor: No weakness.  Psychiatric:        Mood and Affect: Mood normal.        Behavior: Behavior normal.        Thought Content:  Thought content normal.        Judgment: Judgment normal.        Assessment And Plan:     1. Encounter for general adult medical examination w/o abnormal findings Behavior modifications discussed and diet history reviewed.   Pt will continue to exercise regularly and modify diet with low GI, plant based foods and decrease intake of processed foods.  Recommend intake of daily multivitamin, Vitamin D, and calcium.  Recommend mammogram (UTD)and colonoscopy (scheduled for consultation) for  preventive screenings, as well as recommend immunizations that include influenza (received at work but does not remember the date), TDAP °- CBC °- Hemoglobin A1c °- CMP14+EGFR °- Lipid panel ° °2. Encounter for screening for metabolic disorder °- Hemoglobin A1c °- CMP14+EGFR °- Lipid panel ° °3. Vitamin D deficiency °Will check vitamin D level and supplement as needed.    °Also encouraged to spend 15 minutes in the sun daily.  °- VITAMIN D 25 Hydroxy (Vit-D Deficiency, Fractures) ° °4. BMI 29.0-29.9,adult °Encouraged to increase physical activity to 150 minutes a week. Congratulated on her 6 lb weight loss since her last visit ° ° °Patient was given opportunity to ask questions. Patient verbalized understanding of the plan and was able to repeat key elements of the plan. All questions were answered to their satisfaction.  ° °Janece Moore, FNP  ° °I, Janece Moore, FNP, have reviewed all documentation for this visit. The documentation on 06/04/21 for the exam, diagnosis, procedures, and orders are all accurate and complete.  °THE PATIENT IS ENCOURAGED TO PRACTICE SOCIAL DISTANCING DUE TO THE COVID-19 PANDEMIC.   °

## 2021-06-04 NOTE — Patient Instructions (Signed)

## 2021-06-05 LAB — CMP14+EGFR
ALT: 10 IU/L (ref 0–32)
AST: 17 IU/L (ref 0–40)
Albumin/Globulin Ratio: 2 (ref 1.2–2.2)
Albumin: 4.9 g/dL — ABNORMAL HIGH (ref 3.8–4.8)
Alkaline Phosphatase: 52 IU/L (ref 44–121)
BUN/Creatinine Ratio: 10 (ref 9–23)
BUN: 8 mg/dL (ref 6–24)
Bilirubin Total: 0.3 mg/dL (ref 0.0–1.2)
CO2: 23 mmol/L (ref 20–29)
Calcium: 9.4 mg/dL (ref 8.7–10.2)
Chloride: 101 mmol/L (ref 96–106)
Creatinine, Ser: 0.81 mg/dL (ref 0.57–1.00)
Globulin, Total: 2.5 g/dL (ref 1.5–4.5)
Glucose: 78 mg/dL (ref 70–99)
Potassium: 4.4 mmol/L (ref 3.5–5.2)
Sodium: 139 mmol/L (ref 134–144)
Total Protein: 7.4 g/dL (ref 6.0–8.5)
eGFR: 91 mL/min/{1.73_m2} (ref >59)

## 2021-06-05 LAB — CBC
Hematocrit: 36.4 % (ref 34.0–46.6)
Hemoglobin: 12.1 g/dL (ref 11.1–15.9)
MCH: 28.8 pg (ref 26.6–33.0)
MCHC: 33.2 g/dL (ref 31.5–35.7)
MCV: 87 fL (ref 79–97)
Platelets: 247 10*3/uL (ref 150–450)
RBC: 4.2 x10E6/uL (ref 3.77–5.28)
RDW: 13.5 % (ref 11.7–15.4)
WBC: 7.2 10*3/uL (ref 3.4–10.8)

## 2021-06-05 LAB — VITAMIN D 25 HYDROXY (VIT D DEFICIENCY, FRACTURES): Vit D, 25-Hydroxy: 34 ng/mL (ref 30.0–100.0)

## 2021-06-05 LAB — HEMOGLOBIN A1C
Est. average glucose Bld gHb Est-mCnc: 120 mg/dL
Hgb A1c MFr Bld: 5.8 % — ABNORMAL HIGH (ref 4.8–5.6)

## 2021-06-05 LAB — LIPID PANEL
Chol/HDL Ratio: 2.8 ratio (ref 0.0–4.4)
Cholesterol, Total: 177 mg/dL (ref 100–199)
HDL: 64 mg/dL (ref >39)
LDL Chol Calc (NIH): 97 mg/dL (ref 0–99)
Triglycerides: 88 mg/dL (ref 0–149)
VLDL Cholesterol Cal: 16 mg/dL (ref 5–40)

## 2022-05-17 DIAGNOSIS — Z30431 Encounter for routine checking of intrauterine contraceptive device: Secondary | ICD-10-CM | POA: Diagnosis not present

## 2022-05-17 DIAGNOSIS — Z01419 Encounter for gynecological examination (general) (routine) without abnormal findings: Secondary | ICD-10-CM | POA: Diagnosis not present

## 2022-05-17 DIAGNOSIS — Z1231 Encounter for screening mammogram for malignant neoplasm of breast: Secondary | ICD-10-CM | POA: Diagnosis not present

## 2022-05-17 DIAGNOSIS — Z124 Encounter for screening for malignant neoplasm of cervix: Secondary | ICD-10-CM | POA: Diagnosis not present

## 2022-05-17 DIAGNOSIS — Z6830 Body mass index (BMI) 30.0-30.9, adult: Secondary | ICD-10-CM | POA: Diagnosis not present

## 2022-05-17 DIAGNOSIS — Z30430 Encounter for insertion of intrauterine contraceptive device: Secondary | ICD-10-CM | POA: Diagnosis not present

## 2022-05-17 LAB — HM MAMMOGRAPHY

## 2022-05-31 DIAGNOSIS — R928 Other abnormal and inconclusive findings on diagnostic imaging of breast: Secondary | ICD-10-CM | POA: Diagnosis not present

## 2022-05-31 DIAGNOSIS — R92332 Mammographic heterogeneous density, left breast: Secondary | ICD-10-CM | POA: Diagnosis not present

## 2022-05-31 LAB — HM MAMMOGRAPHY

## 2022-06-14 ENCOUNTER — Encounter: Payer: Self-pay | Admitting: Nurse Practitioner

## 2022-06-14 ENCOUNTER — Ambulatory Visit: Payer: No Typology Code available for payment source | Admitting: Nurse Practitioner

## 2022-06-14 VITALS — BP 124/82 | HR 70 | Temp 98.2°F | Ht 66.0 in | Wt 185.0 lb

## 2022-06-14 DIAGNOSIS — Z13220 Encounter for screening for lipoid disorders: Secondary | ICD-10-CM

## 2022-06-14 DIAGNOSIS — Z23 Encounter for immunization: Secondary | ICD-10-CM | POA: Diagnosis not present

## 2022-06-14 DIAGNOSIS — H6123 Impacted cerumen, bilateral: Secondary | ICD-10-CM

## 2022-06-14 DIAGNOSIS — Z0000 Encounter for general adult medical examination without abnormal findings: Secondary | ICD-10-CM

## 2022-06-14 DIAGNOSIS — R194 Change in bowel habit: Secondary | ICD-10-CM | POA: Insufficient documentation

## 2022-06-14 DIAGNOSIS — Z0001 Encounter for general adult medical examination with abnormal findings: Secondary | ICD-10-CM | POA: Diagnosis not present

## 2022-06-14 DIAGNOSIS — Z13228 Encounter for screening for other metabolic disorders: Secondary | ICD-10-CM

## 2022-06-14 DIAGNOSIS — E669 Obesity, unspecified: Secondary | ICD-10-CM | POA: Insufficient documentation

## 2022-06-14 DIAGNOSIS — Z1211 Encounter for screening for malignant neoplasm of colon: Secondary | ICD-10-CM | POA: Diagnosis not present

## 2022-06-14 DIAGNOSIS — E559 Vitamin D deficiency, unspecified: Secondary | ICD-10-CM | POA: Diagnosis not present

## 2022-06-14 DIAGNOSIS — K5900 Constipation, unspecified: Secondary | ICD-10-CM | POA: Insufficient documentation

## 2022-06-14 DIAGNOSIS — K219 Gastro-esophageal reflux disease without esophagitis: Secondary | ICD-10-CM | POA: Insufficient documentation

## 2022-06-14 NOTE — Progress Notes (Signed)
I,Sheena H Holbrook,acting as a Education administrator for Minette Brine, FNP.,have documented all relevant documentation on the behalf of Minette Brine, FNP,as directed by  Minette Brine, FNP while in the presence of Minette Brine, [place].   Subjective:     Patient ID: Tammy Mclaughlin , female    DOB: May 01, 1975 , 47 y.o.   MRN: [id number]   Chief Complaint  Patient presents with   Annual Exam    HPI  Patient presents today for annual exam. She has her GYN care at [facility]. She had her mammogram and annual PAP on January 22nd and f/u February 5th mammogram. No concerns  Wt Readings from Last 3 Encounters: 06/14/22 : 185 lb (83.9 kg) 06/04/21 : 182 lb 3.2 oz (82.6 kg) 05/26/20 : 188 lb 6.4 oz (85.5 kg)       Past Medical History:  Diagnosis Date   Abnormal Pap smear    Anemia    HGSIL (high grade squamous intraepithelial dysplasia)      Family History  Problem Relation Age of Onset   Diabetes Paternal Grandfather    Diabetes Paternal Grandmother    Hypertension Maternal Grandmother    Hypertension Maternal Grandfather    Cancer Mother      Current Outpatient Medications:    levonorgestrel (MIRENA) 20 MCG/24HR IUD, 1 each by Intrauterine route once., Disp: , Rfl:    No Known Allergies    The patient states she uses IUD for birth control.  No LMP recorded. (Menstrual status: IUD).  Negative for Dysmenorrhea and Negative for Menorrhagia. Negative for: breast discharge, breast lump(s), breast pain and breast self exam. Associated symptoms include abnormal vaginal bleeding. Pertinent negatives include abnormal bleeding (hematology), anxiety, decreased libido, depression, difficulty falling sleep, dyspareunia, history of infertility, nocturia, sexual dysfunction, sleep disturbances, urinary incontinence, urinary urgency, vaginal discharge and vaginal itching. Diet regular.  The patient states her exercise level is intermittent. She has a treadmill at home and no longer going to the gym.  She admits she is on and off, eats junk food.   The patient's tobacco use is:  Social History   Tobacco Use  Smoking Status Never  Smokeless Tobacco Never   She has been exposed to passive smoke. The patient's alcohol use is:  Social History   Substance and Sexual Activity  Alcohol Use No   Additional information: Last pap 2023, next one scheduled for 2026.    Review of Systems  Constitutional: Negative.   HENT: Negative.    Eyes: Negative.   Respiratory: Negative.    Cardiovascular: Negative.   Gastrointestinal: Negative.   Endocrine: Negative.   Genitourinary: Negative.   Musculoskeletal: Negative.   Skin: Negative.   Allergic/Immunologic: Negative.   Neurological: Negative.   Hematological: Negative.   Psychiatric/Behavioral: Negative.    All other systems reviewed and are negative.    Today's Vitals   06/14/22 0913  BP: 124/82  Pulse: 70  Temp: 98.2 F (36.8 C)  TempSrc: Oral  SpO2: 95%  Weight: 185 lb (83.9 kg)  Height: 5' 6"$  (1.676 m)   Body mass index is 29.86 kg/m.   Objective:  Physical Exam Constitutional:      General: She is not in acute distress.    Appearance: Normal appearance. She is well-developed. She is obese.  HENT:     Head: Normocephalic and atraumatic.     Right Ear: Hearing and external ear normal. There is impacted cerumen (hard cerumen).     Left Ear: Hearing and external ear  normal. There is impacted cerumen (hard cerumen).     Nose: Nose normal.     Mouth/Throat:     Mouth: Mucous membranes are moist.     Dentition: Abnormal dentition (erythema noted to lower gums).  Eyes:     General: Lids are normal.     Extraocular Movements: Extraocular movements intact.     Conjunctiva/sclera: Conjunctivae normal.     Pupils: Pupils are equal, round, and reactive to light.     Funduscopic exam:    Right eye: No papilledema.        Left eye: No papilledema.  Neck:     Thyroid: No thyroid mass.     Vascular: No carotid bruit.   Cardiovascular:     Rate and Rhythm: Normal rate and regular rhythm.     Pulses: Normal pulses.     Heart sounds: Normal heart sounds. No murmur heard. Pulmonary:     Effort: Pulmonary effort is normal. No respiratory distress.     Breath sounds: Normal breath sounds. No wheezing.  Chest:     Chest wall: No mass.  Breasts:    Tanner Score is 5.     Right: Normal. No mass or tenderness.     Left: Normal. No mass or tenderness.  Abdominal:     General: Abdomen is flat. Bowel sounds are normal. There is no distension.     Palpations: Abdomen is soft.     Tenderness: There is no abdominal tenderness.  Genitourinary:    Rectum: Guaiac result negative.  Musculoskeletal:        General: No swelling. Normal range of motion.     Cervical back: Full passive range of motion without pain, normal range of motion and neck supple.     Right lower leg: No edema.     Left lower leg: No edema.  Lymphadenopathy:     Upper Body:     Right upper body: No supraclavicular, axillary or pectoral adenopathy.     Left upper body: No supraclavicular, axillary or pectoral adenopathy.  Skin:    General: Skin is warm and dry.     Capillary Refill: Capillary refill takes less than 2 seconds.  Neurological:     General: No focal deficit present.     Mental Status: She is alert and oriented to person, place, and time.     Cranial Nerves: No cranial nerve deficit.     Sensory: No sensory deficit.  Psychiatric:        Mood and Affect: Mood normal.        Behavior: Behavior normal.        Thought Content: Thought content normal.        Judgment: Judgment normal.         Assessment And Plan:  1. Encounter for general adult medical examination w/o abnormal findings Behavior modifications discussed and diet history reviewed.   Pt will continue to exercise regularly and modify diet with low GI, plant based foods and decrease intake of processed foods.  Recommend intake of daily multivitamin, Vitamin D, and  calcium.  Recommend mammogram (up to date in Jan 2024 and diagnostic in Feb 2024)and cologuard for preventive screenings, as well as recommend immunizations that include influenza, TDAP - CBC - CMP14+EGFR    2. Encounter for screening for metabolic disorder - Hemoglobin A1c  3. Lipid screening - Lipid panel  4. Colon cancer screening According to USPTF Colorectal cancer Screening guidelines. Colonoscopy is recommended every 3 years, starting at  age 63 years. Order sent to [facility]  5. Need for influenza vaccination Influenza vaccine administered Encouraged to take Tylenol as needed for fever or muscle aches. - Flu Vaccine QUAD 6+ mos PF IM (Fluarix Quad PF)  6. Vitamin D deficiency Will check vitamin D level and supplement as needed.    Also encouraged to spend 15 minutes in the sun daily.  - VITAMIN D 25 Hydroxy (Vit-D Deficiency, Fractures)  7. Bilateral impacted cerumen Wax is removed by with lavage with elephant ear with 1/2 water and 1/2 peroxide. Instructions for home care to prevent wax buildup are given. - Ear Lavage   Patient was given opportunity to ask questions. Patient verbalized understanding of the plan and was able to repeat key elements of the plan. All questions were answered to their satisfaction.   Minette Brine, FNP   I, Minette Brine, FNP, have reviewed all documentation for this visit. The documentation on 06/14/22 for the exam, diagnosis, procedures, and orders are all accurate and complete.   THE PATIENT IS ENCOURAGED TO PRACTICE SOCIAL DISTANCING DUE TO THE COVID-19 PANDEMIC.

## 2022-06-15 LAB — CMP14+EGFR
ALT: 14 IU/L (ref 0–32)
AST: 20 IU/L (ref 0–40)
Albumin/Globulin Ratio: 1.6 (ref 1.2–2.2)
Albumin: 4.4 g/dL (ref 3.9–4.9)
Alkaline Phosphatase: 56 IU/L (ref 44–121)
BUN/Creatinine Ratio: 12 (ref 9–23)
BUN: 9 mg/dL (ref 6–24)
Bilirubin Total: <0.2 mg/dL (ref 0.0–1.2)
CO2: 23 mmol/L (ref 20–29)
Calcium: 9.3 mg/dL (ref 8.7–10.2)
Chloride: 102 mmol/L (ref 96–106)
Creatinine, Ser: 0.76 mg/dL (ref 0.57–1.00)
Globulin, Total: 2.7 g/dL (ref 1.5–4.5)
Glucose: 84 mg/dL (ref 70–99)
Potassium: 4.5 mmol/L (ref 3.5–5.2)
Sodium: 138 mmol/L (ref 134–144)
Total Protein: 7.1 g/dL (ref 6.0–8.5)
eGFR: 97 mL/min/{1.73_m2} (ref >59)

## 2022-06-15 LAB — CBC
Hematocrit: 34 % (ref 34.0–46.6)
Hemoglobin: 11.7 g/dL (ref 11.1–15.9)
MCH: 29.6 pg (ref 26.6–33.0)
MCHC: 34.4 g/dL (ref 31.5–35.7)
MCV: 86 fL (ref 79–97)
Platelets: 275 10*3/uL (ref 150–450)
RBC: 3.95 x10E6/uL (ref 3.77–5.28)
RDW: 13.8 % (ref 11.7–15.4)
WBC: 7.3 10*3/uL (ref 3.4–10.8)

## 2022-06-15 LAB — LIPID PANEL
Chol/HDL Ratio: 3.2 ratio (ref 0.0–4.4)
Cholesterol, Total: 214 mg/dL — ABNORMAL HIGH (ref 100–199)
HDL: 66 mg/dL (ref >39)
LDL Chol Calc (NIH): 127 mg/dL — ABNORMAL HIGH (ref 0–99)
Triglycerides: 122 mg/dL (ref 0–149)
VLDL Cholesterol Cal: 21 mg/dL (ref 5–40)

## 2022-06-15 LAB — HEMOGLOBIN A1C
Est. average glucose Bld gHb Est-mCnc: 123 mg/dL
Hgb A1c MFr Bld: 5.9 % — ABNORMAL HIGH (ref 4.8–5.6)

## 2022-06-15 LAB — VITAMIN D 25 HYDROXY (VIT D DEFICIENCY, FRACTURES): Vit D, 25-Hydroxy: 13.4 ng/mL — ABNORMAL LOW (ref 30.0–100.0)

## 2022-06-23 ENCOUNTER — Encounter: Payer: Self-pay | Admitting: Nurse Practitioner

## 2022-07-01 ENCOUNTER — Other Ambulatory Visit: Payer: Self-pay | Admitting: Nurse Practitioner

## 2022-07-01 DIAGNOSIS — E559 Vitamin D deficiency, unspecified: Secondary | ICD-10-CM

## 2022-07-01 MED ORDER — VITAMIN D (ERGOCALCIFEROL) 1.25 MG (50000 UNIT) PO CAPS: 50000.0000 [IU] | ORAL_CAPSULE | ORAL | 1 refills | Status: DC

## 2022-08-30 IMAGING — MG MM BREAST LOCALIZATION CLIP
4 series · 4 of 12 positions shown · non-contrast
Comparison: COMPARISON
Previous exam(s).

CLINICAL DATA: Status post ultrasound-guided core biopsy of a right
breast mass.

EXAM:
3D DIAGNOSTIC RIGHT MAMMOGRAM POST ULTRASOUND BIOPSY

[R CC synth-2D]
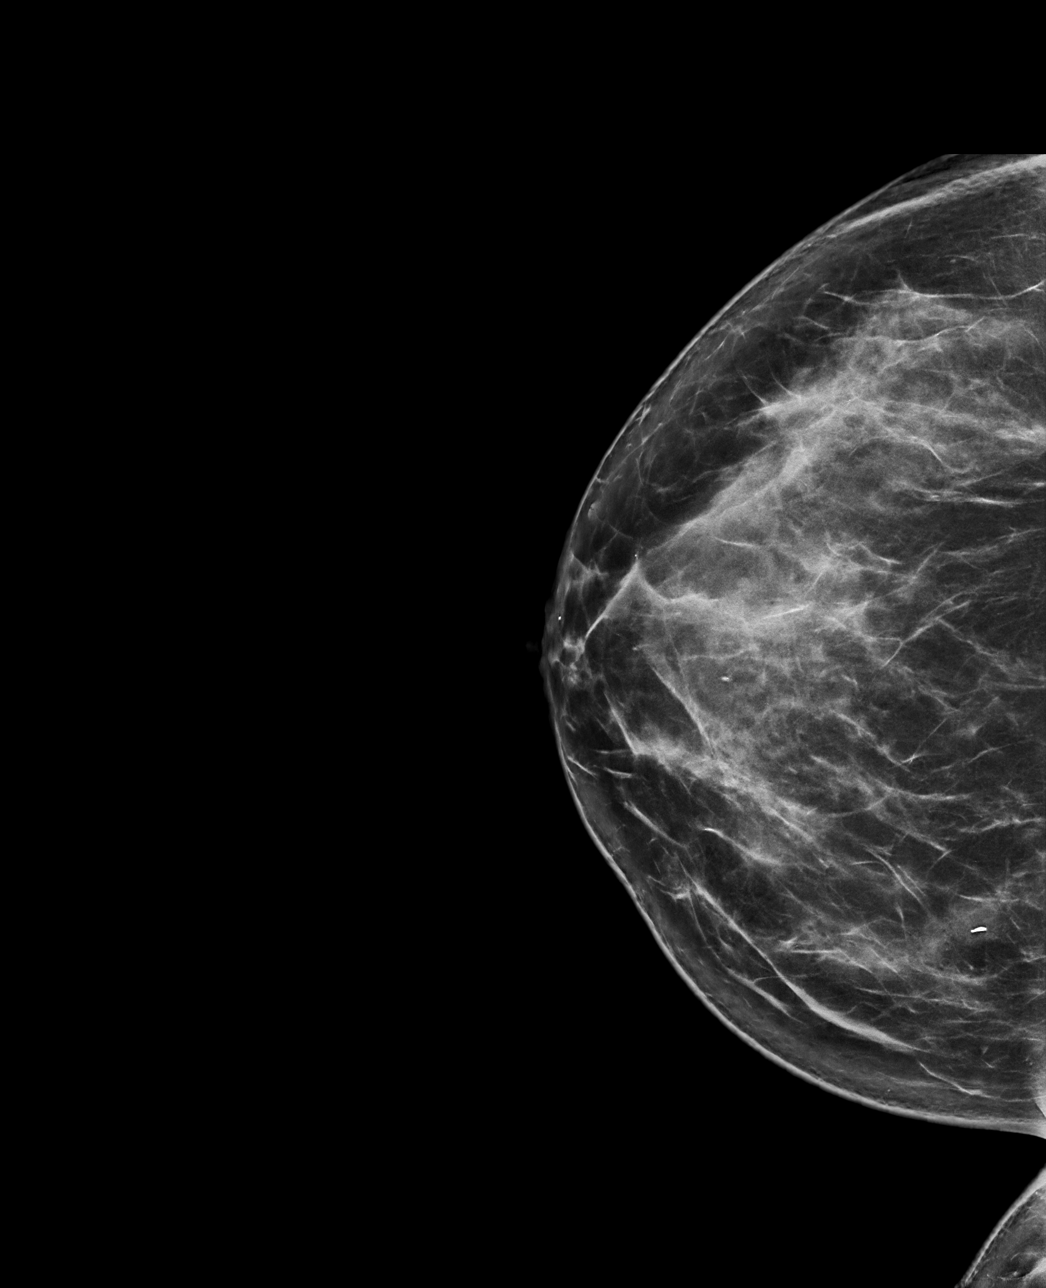

[R ML synth-2D]
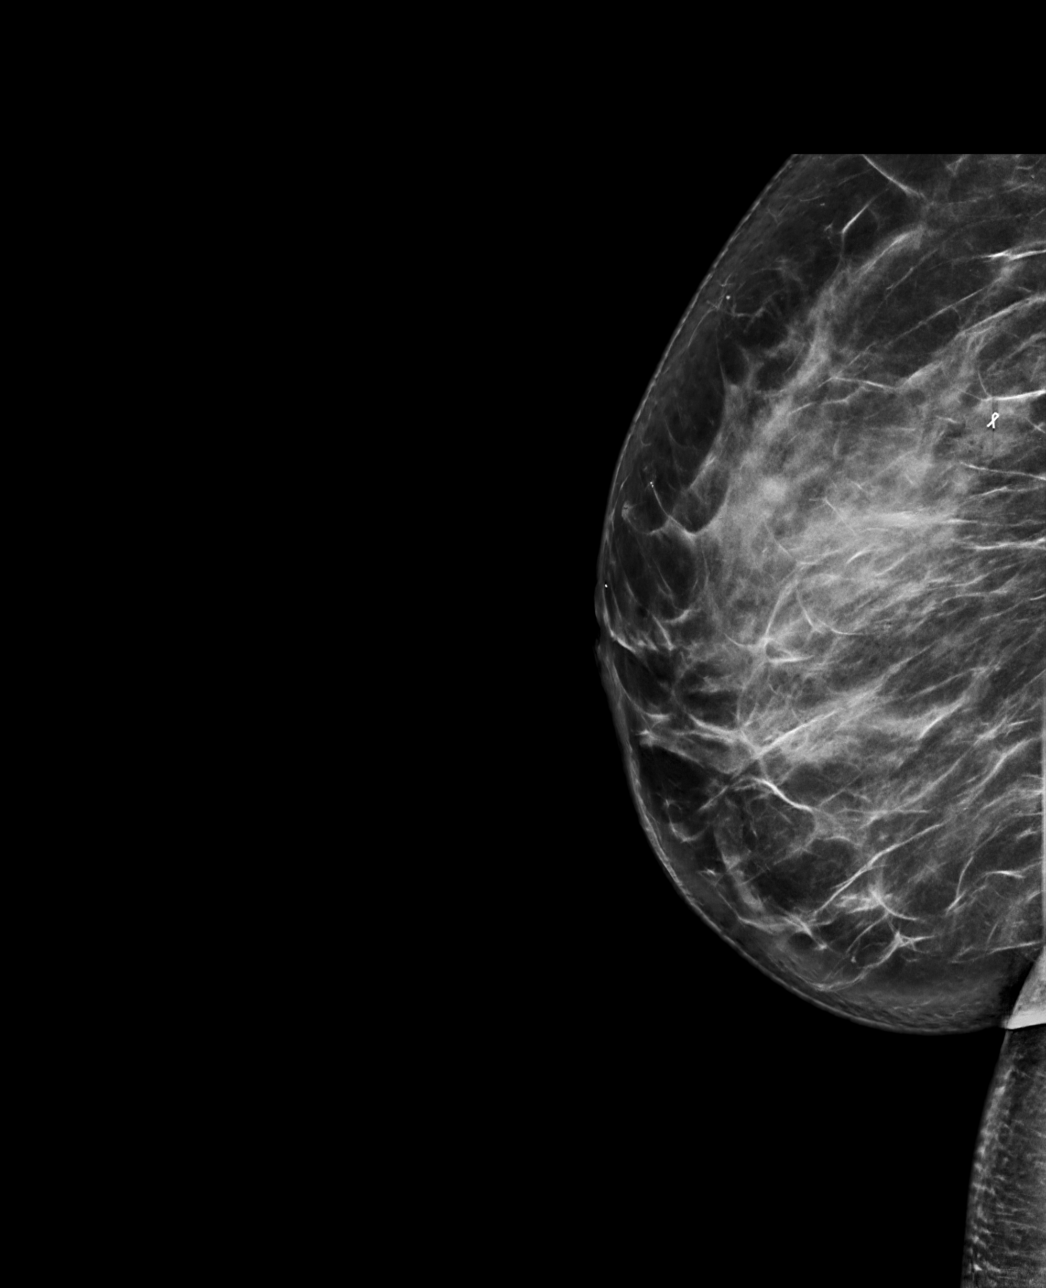

[R CC tomo · tomo slice 45/89.0]
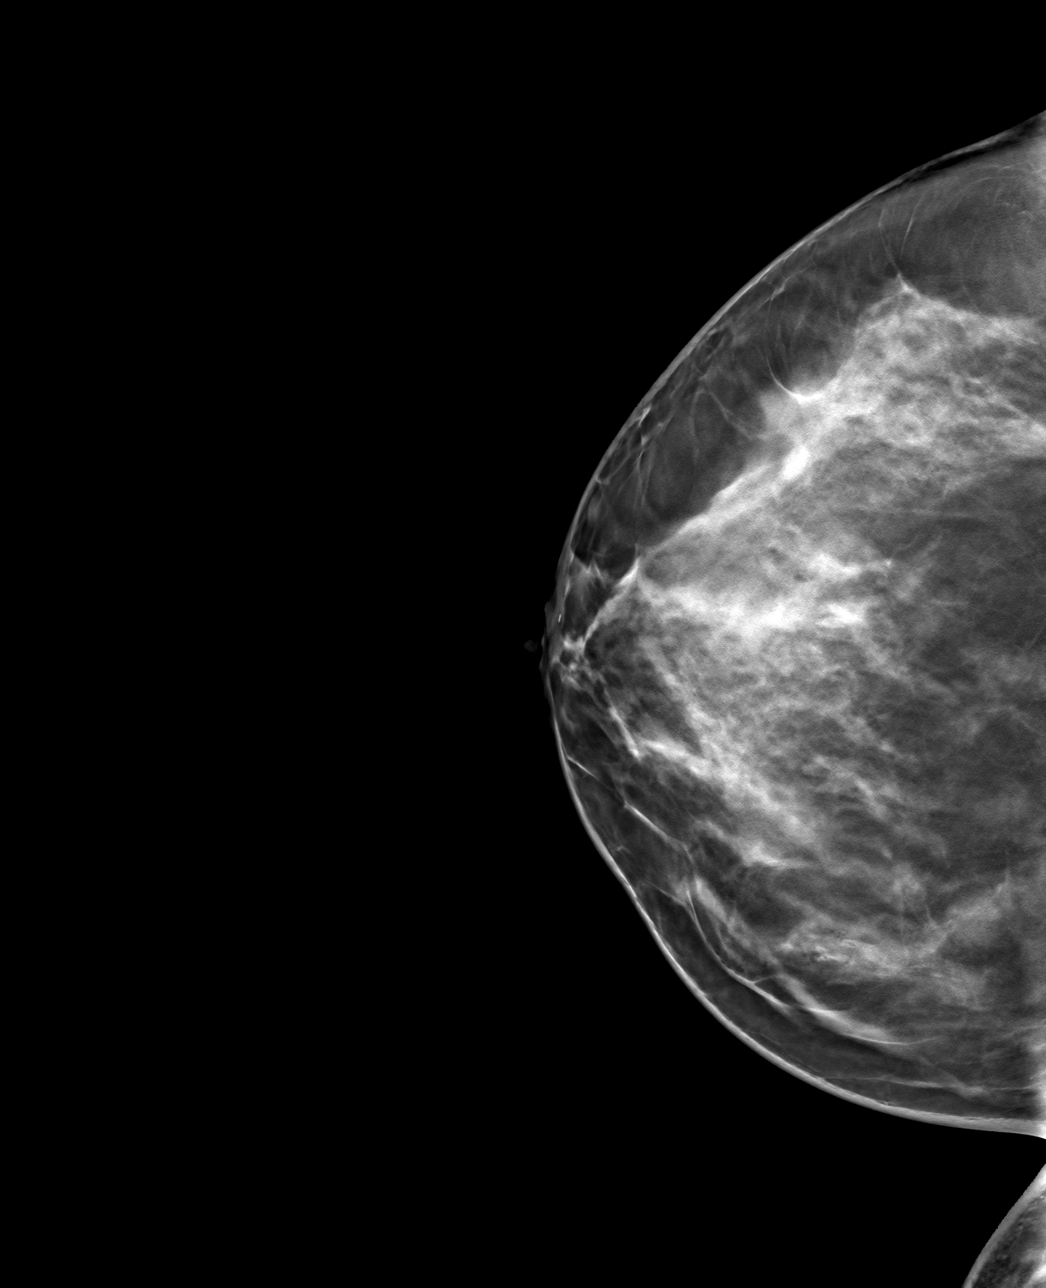

[R ML tomo · tomo slice 45/90.0]
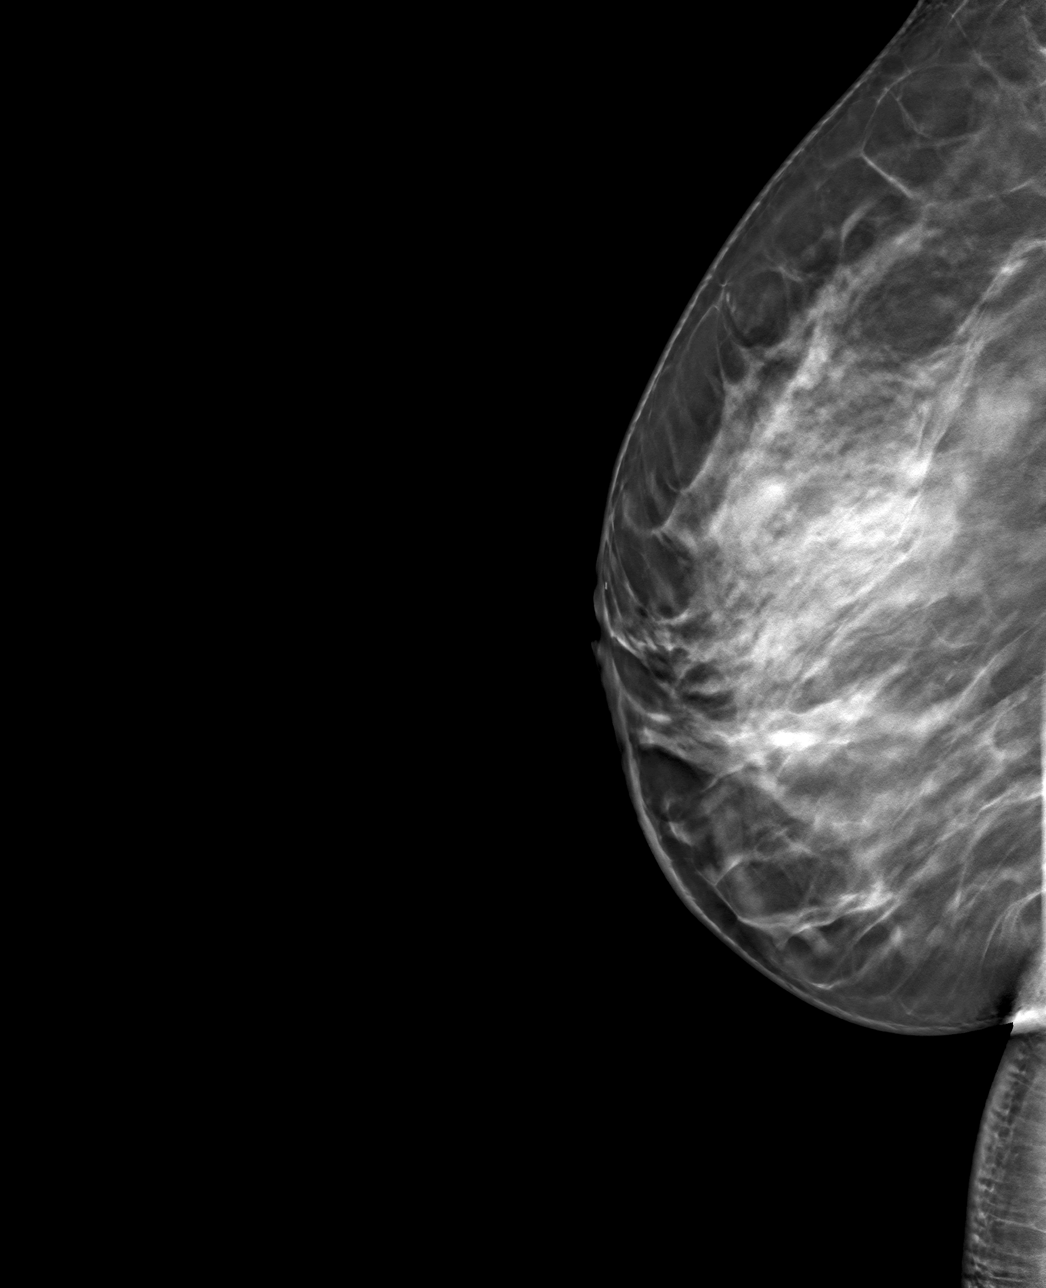

[4 of 12 positions shown; findings below may reference images not displayed]

FINDINGS: 3D Mammographic images were obtained following ultrasound guided
biopsy of a right breast mass. The biopsy marking clip is in
expected location in the upper-inner quadrant of the right breast.
IMPRESSION: Appropriate positioning of the ribbon shaped biopsy marking clip at
the site of biopsy in the upper-inner quadrant of the right breast.

Final Assessment: Post Procedure Mammograms for Marker Placement

## 2023-01-30 ENCOUNTER — Ambulatory Visit: Payer: Self-pay

## 2023-03-22 ENCOUNTER — Ambulatory Visit (INDEPENDENT_AMBULATORY_CARE_PROVIDER_SITE_OTHER): Payer: No Typology Code available for payment source | Admitting: Family Medicine

## 2023-03-22 ENCOUNTER — Encounter: Payer: Self-pay | Admitting: Family Medicine

## 2023-03-22 VITALS — BP 120/82 | HR 76 | Temp 98.2°F | Ht 66.0 in | Wt 188.0 lb

## 2023-03-22 DIAGNOSIS — R300 Dysuria: Secondary | ICD-10-CM | POA: Diagnosis not present

## 2023-03-22 DIAGNOSIS — Z2821 Immunization not carried out because of patient refusal: Secondary | ICD-10-CM

## 2023-03-22 DIAGNOSIS — T3695XA Adverse effect of unspecified systemic antibiotic, initial encounter: Secondary | ICD-10-CM | POA: Diagnosis not present

## 2023-03-22 DIAGNOSIS — B379 Candidiasis, unspecified: Secondary | ICD-10-CM

## 2023-03-22 DIAGNOSIS — N3000 Acute cystitis without hematuria: Secondary | ICD-10-CM

## 2023-03-22 LAB — POCT URINALYSIS DIPSTICK
Bilirubin, UA: NEGATIVE
Blood, UA: NEGATIVE
Glucose, UA: NEGATIVE
Ketones, UA: NEGATIVE
Nitrite, UA: NEGATIVE
Protein, UA: NEGATIVE
Spec Grav, UA: 1.02 (ref 1.010–1.025)
Urobilinogen, UA: 0.2 U/dL
pH, UA: 6.5 (ref 5.0–8.0)

## 2023-03-22 MED ORDER — FLUCONAZOLE 150 MG PO TABS
150.0000 mg | ORAL_TABLET | Freq: Every day | ORAL | 0 refills | Status: DC
Start: 2023-03-22 — End: 2023-07-27

## 2023-03-22 MED ORDER — CIPROFLOXACIN HCL 500 MG PO TABS
500.0000 mg | ORAL_TABLET | Freq: Two times a day (BID) | ORAL | 0 refills | Status: AC
Start: 2023-03-22 — End: 2023-03-25

## 2023-03-22 NOTE — Progress Notes (Signed)
I,Tammy Mclaughlin, CMA,acting as a Neurosurgeon for [employer], NP.,have documented all relevant documentation on the behalf of Tammy Hose, NP,as directed by  Tammy Hose, NP while in the presence of Tammy Hose, NP.  Subjective:  Patient ID: Tammy Mclaughlin , female    DOB: [birth year] , 47 y.o.   MRN: [id number]  Chief Complaint  Patient presents with   Dysuria    HPI  Patient is a 47 year old female who presents today possible Urinary Tract Infection. She reports she has been having the urgency, frequency and spasm like feeling after urination for the past two  days. She denies any discoloration or smell to her urine.      Past Medical History:  Diagnosis Date   Abnormal Pap smear    Anemia    HGSIL (high grade squamous intraepithelial dysplasia)      Family History  Problem Relation Age of Onset   Diabetes Paternal Grandfather    Diabetes Paternal Grandmother    Hypertension Maternal Grandmother    Hypertension Maternal Grandfather    Cancer Mother      Current Outpatient Medications:    fluconazole (DIFLUCAN) 150 MG tablet, Take 1 tablet (150 mg total) by mouth daily., Disp: 1 tablet, Rfl: 0   levonorgestrel (MIRENA) 20 MCG/24HR IUD, 1 each by Intrauterine route once., Disp: , Rfl:    Vitamin D, Ergocalciferol, (DRISDOL) 1.25 MG (50000 UNIT) CAPS capsule, Take 1 capsule (50,000 Units total) by mouth 2 (two) times a week. (Patient not taking: Reported on 03/22/2023), Disp: 24 capsule, Rfl: 1   No Known Allergies   Review of Systems  Constitutional: Negative.   HENT: Negative.    Respiratory: Negative.    Cardiovascular: Negative.   Genitourinary:  Positive for dysuria, frequency and urgency. Negative for flank pain.  Skin: Negative.      Today's Vitals   03/22/23 1502  BP: 120/82  Pulse: 76  Temp: 98.2 F (36.8 C)  Weight: 188 lb (85.3 kg)  Height: 5\' 6"  (1.676 m)  PainSc: 0-No pain   Body mass index is 30.34 kg/m.  Wt Readings from Last 3 Encounters:   03/22/23 188 lb (85.3 kg)  06/14/22 185 lb (83.9 kg)  06/04/21 182 lb 3.2 oz (82.6 kg)    The 10-year ASCVD risk score (Arnett DK, et al., 2019) is: 1.9%   Values used to calculate the score:     Age: 45 years     Sex: Female     Is Non-Hispanic African American: Yes     Diabetic: No     Tobacco smoker: Yes     Systolic Blood Pressure: 120 mmHg     Is BP treated: No     HDL Cholesterol: 66 mg/dL     Total Cholesterol: 214 mg/dL  Objective:  Physical Exam HENT:     Head: Normocephalic.  Cardiovascular:     Rate and Rhythm: Normal rate.  Pulmonary:     Effort: Pulmonary effort is normal.     Breath sounds: Normal breath sounds.  Abdominal:     General: Bowel sounds are normal.     Tenderness: There is no abdominal tenderness. There is no right CVA tenderness or left CVA tenderness.  Neurological:     Mental Status: She is alert and oriented to person, place, and time.         Assessment And Plan:  Dysuria -     POCT urinalysis dipstick  Acute cystitis without hematuria -  Ciprofloxacin HCl; Take 1 tablet (500 mg total) by mouth 2 (two) times daily for 3 days.  Dispense: 6 tablet; Refill: 0  Antibiotic-induced yeast infection -     Fluconazole; Take 1 tablet (150 mg total) by mouth daily.  Dispense: 1 tablet; Refill: 0  COVID-19 vaccination declined    Return if symptoms worsen or fail to improve, for Keep next Scheduled Appt.  Patient was given opportunity to ask questions. Patient verbalized understanding of the plan and was able to repeat key elements of the plan. All questions were answered to their satisfaction.    I, Tammy Hose, NP, have reviewed all documentation for this visit. The documentation on 03/25/2023 for the exam, diagnosis, procedures, and orders are all accurate and complete.    IF YOU HAVE BEEN REFERRED TO A SPECIALIST, IT MAY TAKE 1-2 WEEKS TO SCHEDULE/PROCESS THE REFERRAL. IF YOU HAVE NOT HEARD FROM US/SPECIALIST IN TWO WEEKS, PLEASE GIVE  [place] A CALL AT [phone].

## 2023-03-26 DIAGNOSIS — T3695XA Adverse effect of unspecified systemic antibiotic, initial encounter: Secondary | ICD-10-CM | POA: Insufficient documentation

## 2023-03-26 DIAGNOSIS — Z2821 Immunization not carried out because of patient refusal: Secondary | ICD-10-CM | POA: Insufficient documentation

## 2023-03-26 DIAGNOSIS — R300 Dysuria: Secondary | ICD-10-CM | POA: Insufficient documentation

## 2023-03-26 DIAGNOSIS — N3000 Acute cystitis without hematuria: Secondary | ICD-10-CM | POA: Insufficient documentation

## 2023-03-26 DIAGNOSIS — B379 Candidiasis, unspecified: Secondary | ICD-10-CM | POA: Insufficient documentation

## 2023-06-16 ENCOUNTER — Encounter: Payer: No Typology Code available for payment source | Admitting: Nurse Practitioner

## 2023-07-27 ENCOUNTER — Encounter: Payer: Self-pay | Admitting: Family Medicine

## 2023-07-27 ENCOUNTER — Ambulatory Visit (INDEPENDENT_AMBULATORY_CARE_PROVIDER_SITE_OTHER): Payer: No Typology Code available for payment source | Admitting: Family Medicine

## 2023-07-27 VITALS — BP 114/70 | HR 94 | Temp 98.7°F | Ht 66.0 in | Wt 184.0 lb

## 2023-07-27 DIAGNOSIS — Z5320 Procedure and treatment not carried out because of patient's decision for unspecified reasons: Secondary | ICD-10-CM

## 2023-07-27 DIAGNOSIS — Z0000 Encounter for general adult medical examination without abnormal findings: Secondary | ICD-10-CM | POA: Diagnosis not present

## 2023-07-27 DIAGNOSIS — R7303 Prediabetes: Secondary | ICD-10-CM | POA: Diagnosis not present

## 2023-07-27 DIAGNOSIS — E663 Overweight: Secondary | ICD-10-CM | POA: Insufficient documentation

## 2023-07-27 DIAGNOSIS — E559 Vitamin D deficiency, unspecified: Secondary | ICD-10-CM

## 2023-07-27 DIAGNOSIS — Z1211 Encounter for screening for malignant neoplasm of colon: Secondary | ICD-10-CM | POA: Insufficient documentation

## 2023-07-27 DIAGNOSIS — E782 Mixed hyperlipidemia: Secondary | ICD-10-CM | POA: Insufficient documentation

## 2023-07-27 NOTE — Assessment & Plan Note (Addendum)
 Low-fat diet recommended.  Lipid Panel     Component Value Date/Time   CHOL 214 (H) 06/14/2022 1015   TRIG 122 06/14/2022 1015   HDL 66 06/14/2022 1015   CHOLHDL 3.2 06/14/2022 1015   LDLCALC 127 (H) 06/14/2022 1015   LABVLDL 21 06/14/2022 1015

## 2023-07-27 NOTE — Assessment & Plan Note (Signed)
 Vitamin D levels checked.  Currently on once daily 5000 units.

## 2023-07-27 NOTE — Progress Notes (Signed)
 I,Jameka J Llittleton, CMA,acting as a Neurosurgeon for [employer], NP.,have documented all relevant documentation on the behalf of [name], NP,as directed by  [name], NP while in the presence of [name], NP.  Subjective:    Patient ID: Tammy Mclaughlin , female    DOB: 1975/07/27 , 48 y.o.   MRN: [id number]  Chief Complaint  Patient presents with   Annual Exam    HPI  Patient is 48 year old year old female with diagnosis of prediabetes, mixed hyperlipidemia who presents today for her annual physical examination. She has her GYN care at [facility] and she states that she is up to date with her pap smear and mammogram. She had both of them on 05/31/2022. Patient states that she has started walking daily for 30 minutes because she has a family history of diabetes and heart disease , so does not want the pattern to continue with her.     Past Medical History:  Diagnosis Date   Abnormal Pap smear    Anemia    HGSIL (high grade squamous intraepithelial dysplasia)      Family History  Problem Relation Age of Onset   Diabetes Paternal Grandfather    Diabetes Paternal Grandmother    Hypertension Maternal Grandmother    Hypertension Maternal Grandfather    Cancer Mother      Current Outpatient Medications:    cholecalciferol (VITAMIN D3) 25 MCG (1000 UNIT) tablet, Take 1,000 Units by mouth daily., Disp: , Rfl:    levonorgestrel (MIRENA) 20 MCG/24HR IUD, 1 each by Intrauterine route once., Disp: , Rfl:    No Known Allergies     Social History   Tobacco Use  Smoking Status Never  Smokeless Tobacco Never      Review of Systems  Constitutional: Negative.   HENT: Negative.    Eyes: Negative.   Respiratory: Negative.    Cardiovascular: Negative.   Gastrointestinal: Negative.   Endocrine: Negative.   Genitourinary: Negative.   Musculoskeletal: Negative.   Skin: Negative.   Allergic/Immunologic: Negative.   Neurological: Negative.   Hematological:  Negative.   Psychiatric/Behavioral: Negative.       Today's Vitals   07/27/23 0827  BP: 114/70  Pulse: 94  Temp: 98.7 F (37.1 C)  TempSrc: Oral  Weight: 184 lb (83.5 kg)  Height: 5\' 6"  (1.676 m)  PainSc: 0-No pain   Body mass index is 29.7 kg/m.  Wt Readings from Last 3 Encounters:  07/27/23 184 lb (83.5 kg)  03/22/23 188 lb (85.3 kg)  06/14/22 185 lb (83.9 kg)     Objective:  Physical Exam HENT:     Head: Normocephalic.     Nose: Nose normal.  Cardiovascular:     Rate and Rhythm: Normal rate.  Pulmonary:     Effort: Pulmonary effort is normal.  Abdominal:     General: Bowel sounds are normal.  Musculoskeletal:        General: Normal range of motion.  Skin:    General: Skin is warm and dry.  Neurological:     General: No focal deficit present.     Mental Status: She is alert and oriented to person, place, and time.  Psychiatric:        Mood and Affect: Mood normal.        Behavior: Behavior normal.         Assessment And Plan:     Encounter for general adult medical examination w/o abnormal findings -  CBC -     CMP14+EGFR  Vitamin D deficiency Assessment & Plan: Vitamin D levels checked.  Currently on once daily 5000 units.  Orders: -     VITAMIN D 25 Hydroxy (Vit-D Deficiency, Fractures)  HIV screening declined  Prediabetes Assessment & Plan: Lab Results  Component Value Date   HGBA1C 5.9 (H) 06/14/2022   HGBA1C 5.8 (H) 06/04/2021   HGBA1C 5.5 05/17/2018   Check levels today.  Orders: -     Hemoglobin A1c  Mixed hyperlipidemia Assessment & Plan: Low-fat diet recommended.  Lipid Panel     Component Value Date/Time   CHOL 214 (H) 06/14/2022 1015   TRIG 122 06/14/2022 1015   HDL 66 06/14/2022 1015   CHOLHDL 3.2 06/14/2022 1015   LDLCALC 127 (H) 06/14/2022 1015   LABVLDL 21 06/14/2022 1015     Orders: -     Lipid panel  Screening for colon cancer -     Ambulatory referral to Gastroenterology  Overweight (BMI  25.0-29.9) Assessment & Plan: Daily walks recommended      Return for 1 year physical. Patient was given opportunity to ask questions. Patient verbalized understanding of the plan and was able to repeat key elements of the plan. All questions were answered to their satisfaction.   I, [name], NP, have reviewed all documentation for this visit. The documentation on 07/27/2023 for the exam, diagnosis, procedures, and orders are all accurate and complete.

## 2023-07-27 NOTE — Assessment & Plan Note (Addendum)
 Lab Results  Component Value Date   HGBA1C 5.9 (H) 06/14/2022   HGBA1C 5.8 (H) 06/04/2021   HGBA1C 5.5 05/17/2018   Check levels today.

## 2023-07-27 NOTE — Patient Instructions (Signed)

## 2023-07-27 NOTE — Assessment & Plan Note (Signed)
 Daily walks recommended

## 2023-07-28 LAB — CBC
Hematocrit: 36.8 % (ref 34.0–46.6)
Hemoglobin: 11.9 g/dL (ref 11.1–15.9)
MCH: 28.7 pg (ref 26.6–33.0)
MCHC: 32.3 g/dL (ref 31.5–35.7)
MCV: 89 fL (ref 79–97)
Platelets: 248 10*3/uL (ref 150–450)
RBC: 4.14 x10E6/uL (ref 3.77–5.28)
RDW: 13.5 % (ref 11.7–15.4)
WBC: 8.6 10*3/uL (ref 3.4–10.8)

## 2023-07-28 LAB — LIPID PANEL
Chol/HDL Ratio: 3 ratio (ref 0.0–4.4)
Cholesterol, Total: 200 mg/dL — ABNORMAL HIGH (ref 100–199)
HDL: 67 mg/dL (ref >39)
LDL Chol Calc (NIH): 115 mg/dL — ABNORMAL HIGH (ref 0–99)
Triglycerides: 103 mg/dL (ref 0–149)
VLDL Cholesterol Cal: 18 mg/dL (ref 5–40)

## 2023-07-28 LAB — CMP14+EGFR
ALT: 12 IU/L (ref 0–32)
AST: 14 IU/L (ref 0–40)
Albumin: 4.4 g/dL (ref 3.9–4.9)
Alkaline Phosphatase: 56 IU/L (ref 44–121)
BUN/Creatinine Ratio: 8 — ABNORMAL LOW (ref 9–23)
BUN: 6 mg/dL (ref 6–24)
Bilirubin Total: 0.3 mg/dL (ref 0.0–1.2)
CO2: 21 mmol/L (ref 20–29)
Calcium: 9.3 mg/dL (ref 8.7–10.2)
Chloride: 103 mmol/L (ref 96–106)
Creatinine, Ser: 0.76 mg/dL (ref 0.57–1.00)
Globulin, Total: 2.7 g/dL (ref 1.5–4.5)
Glucose: 84 mg/dL (ref 70–99)
Potassium: 4.3 mmol/L (ref 3.5–5.2)
Sodium: 140 mmol/L (ref 134–144)
Total Protein: 7.1 g/dL (ref 6.0–8.5)
eGFR: 97 mL/min/{1.73_m2} (ref >59)

## 2023-07-28 LAB — HEMOGLOBIN A1C
Est. average glucose Bld gHb Est-mCnc: 120 mg/dL
Hgb A1c MFr Bld: 5.8 % — ABNORMAL HIGH (ref 4.8–5.6)

## 2023-07-28 LAB — VITAMIN D 25 HYDROXY (VIT D DEFICIENCY, FRACTURES): Vit D, 25-Hydroxy: 26.1 ng/mL — ABNORMAL LOW (ref 30.0–100.0)

## 2023-08-18 ENCOUNTER — Other Ambulatory Visit: Payer: Self-pay | Admitting: Obstetrics and Gynecology

## 2023-08-18 DIAGNOSIS — Z1231 Encounter for screening mammogram for malignant neoplasm of breast: Secondary | ICD-10-CM

## 2023-09-16 ENCOUNTER — Ambulatory Visit
Admission: RE | Admit: 2023-09-16 | Discharge: 2023-09-16 | Disposition: A | Discharge status: Home / Self Care | Payer: Self-pay | Source: Ambulatory Visit | Attending: Obstetrics and Gynecology | Admitting: Obstetrics and Gynecology

## 2023-09-16 DIAGNOSIS — Z01419 Encounter for gynecological examination (general) (routine) without abnormal findings: Secondary | ICD-10-CM | POA: Diagnosis not present

## 2023-09-16 DIAGNOSIS — Z1231 Encounter for screening mammogram for malignant neoplasm of breast: Secondary | ICD-10-CM

## 2023-10-05 ENCOUNTER — Encounter: Payer: Self-pay | Admitting: Family Medicine

## 2024-07-31 ENCOUNTER — Encounter: Payer: Self-pay | Admitting: Nurse Practitioner
